# Patient Record
Sex: Female | Born: 1975 | Race: White | Hispanic: No | Marital: Married | Smoking: Never smoker
Health system: Southern US, Community
[De-identification: ages and names within clinical notes are randomized; demographics above are authoritative.]

## PROBLEM LIST (undated history)

## (undated) DIAGNOSIS — N200 Calculus of kidney: Secondary | ICD-10-CM

## (undated) DIAGNOSIS — N2589 Other disorders resulting from impaired renal tubular function: Secondary | ICD-10-CM

## (undated) DIAGNOSIS — K760 Fatty (change of) liver, not elsewhere classified: Secondary | ICD-10-CM

## (undated) DIAGNOSIS — Z8619 Personal history of other infectious and parasitic diseases: Secondary | ICD-10-CM

## (undated) DIAGNOSIS — E039 Hypothyroidism, unspecified: Secondary | ICD-10-CM

## (undated) DIAGNOSIS — M069 Rheumatoid arthritis, unspecified: Secondary | ICD-10-CM

## (undated) DIAGNOSIS — N2889 Other specified disorders of kidney and ureter: Secondary | ICD-10-CM

## (undated) DIAGNOSIS — N182 Chronic kidney disease, stage 2 (mild): Secondary | ICD-10-CM

## (undated) DIAGNOSIS — M1990 Unspecified osteoarthritis, unspecified site: Secondary | ICD-10-CM

## (undated) DIAGNOSIS — G43109 Migraine with aura, not intractable, without status migrainosus: Secondary | ICD-10-CM

## (undated) HISTORY — DX: Other specified disorders of kidney and ureter: N28.89

## (undated) HISTORY — DX: Fatty (change of) liver, not elsewhere classified: K76.0

## (undated) HISTORY — DX: Chronic kidney disease, stage 2 (mild): N18.2

## (undated) HISTORY — DX: Hypothyroidism, unspecified: E03.9

## (undated) HISTORY — DX: Calculus of kidney: N20.0

## (undated) HISTORY — DX: Personal history of other infectious and parasitic diseases: Z86.19

## (undated) HISTORY — PX: WISDOM TOOTH EXTRACTION: SHX21

## (undated) HISTORY — DX: Migraine with aura, not intractable, without status migrainosus: G43.109

## (undated) HISTORY — DX: Other disorders resulting from impaired renal tubular function: N25.89

## (undated) HISTORY — DX: Unspecified osteoarthritis, unspecified site: M19.90

## (undated) HISTORY — DX: Rheumatoid arthritis, unspecified: M06.9

---

## 2004-11-30 ENCOUNTER — Inpatient Hospital Stay (HOSPITAL_COMMUNITY): Admission: AD | Admit: 2004-11-30 | Discharge: 2004-11-30 | Payer: Self-pay | Admitting: Obstetrics and Gynecology

## 2005-01-11 ENCOUNTER — Other Ambulatory Visit: Admission: RE | Admit: 2005-01-11 | Discharge: 2005-01-11 | Payer: Self-pay | Admitting: Obstetrics and Gynecology

## 2005-07-07 ENCOUNTER — Inpatient Hospital Stay (HOSPITAL_COMMUNITY): Admission: AD | Admit: 2005-07-07 | Discharge: 2005-07-09 | Payer: Self-pay | Admitting: Obstetrics and Gynecology

## 2005-08-08 ENCOUNTER — Other Ambulatory Visit: Admission: RE | Admit: 2005-08-08 | Discharge: 2005-08-08 | Payer: Self-pay | Admitting: Obstetrics and Gynecology

## 2007-08-22 ENCOUNTER — Inpatient Hospital Stay (HOSPITAL_COMMUNITY): Admission: AD | Admit: 2007-08-22 | Discharge: 2007-08-22 | Payer: Self-pay | Admitting: Obstetrics and Gynecology

## 2007-08-31 ENCOUNTER — Inpatient Hospital Stay (HOSPITAL_COMMUNITY): Admission: AD | Admit: 2007-08-31 | Discharge: 2007-09-01 | Payer: Self-pay | Admitting: Obstetrics and Gynecology

## 2011-03-21 LAB — CBC
HCT: 28.5 — ABNORMAL LOW
Hemoglobin: 8.9 — ABNORMAL LOW
MCHC: 34.3
Platelets: 250
RBC: 3.43 — ABNORMAL LOW
RBC: 3.77 — ABNORMAL LOW
WBC: 14.9 — ABNORMAL HIGH

## 2012-06-06 ENCOUNTER — Other Ambulatory Visit: Payer: Self-pay | Admitting: Obstetrics and Gynecology

## 2012-06-06 DIAGNOSIS — N644 Mastodynia: Secondary | ICD-10-CM

## 2012-06-06 DIAGNOSIS — N6459 Other signs and symptoms in breast: Secondary | ICD-10-CM

## 2012-06-14 ENCOUNTER — Ambulatory Visit
Admission: RE | Admit: 2012-06-14 | Discharge: 2012-06-14 | Disposition: A | Discharge status: Home / Self Care | Payer: No Typology Code available for payment source | Source: Ambulatory Visit | Attending: Obstetrics and Gynecology | Admitting: Obstetrics and Gynecology

## 2012-06-14 ENCOUNTER — Other Ambulatory Visit: Payer: Self-pay | Admitting: Obstetrics and Gynecology

## 2012-06-14 DIAGNOSIS — N644 Mastodynia: Secondary | ICD-10-CM

## 2012-06-14 DIAGNOSIS — N6459 Other signs and symptoms in breast: Secondary | ICD-10-CM

## 2013-09-25 DIAGNOSIS — E039 Hypothyroidism, unspecified: Secondary | ICD-10-CM

## 2013-09-25 HISTORY — DX: Hypothyroidism, unspecified: E03.9

## 2014-01-17 ENCOUNTER — Ambulatory Visit (INDEPENDENT_AMBULATORY_CARE_PROVIDER_SITE_OTHER): Payer: No Typology Code available for payment source | Admitting: Nurse Practitioner

## 2014-01-17 ENCOUNTER — Encounter: Payer: Self-pay | Admitting: Nurse Practitioner

## 2014-01-17 VITALS — BP 102/68 | HR 71 | Temp 97.9°F | Resp 18 | Ht 67.2 in | Wt 106.0 lb

## 2014-01-17 DIAGNOSIS — L989 Disorder of the skin and subcutaneous tissue, unspecified: Secondary | ICD-10-CM

## 2014-01-17 DIAGNOSIS — E079 Disorder of thyroid, unspecified: Secondary | ICD-10-CM | POA: Insufficient documentation

## 2014-01-17 NOTE — Progress Notes (Signed)
Pre visit review using our clinic review tool, if applicable. No additional management support is needed unless otherwise documented below in the visit note. 

## 2014-01-17 NOTE — Progress Notes (Signed)
Subjective:     Elaine Hall is a 38 y.o. female wishes to establish care with dr Anitra Lauth. She is accompanied by her school age son. She has an appointment scheduled for next week. Today she c/o swollen area on L thigh. She noticed it yesterday. It is mildly tender. No discoloration. No known injury. History includes recent diagnosis of thyroid disease, diagnosed by gynecologist. Treatment started:levothyroxine, had recent dose reduction to 93mcg. Pt states prior to diagnosis, she had lost "a lot" of weight. Feels well.    History   Social History  . Marital Status: Married    Spouse Name: N/A    Number of Children: N/A  . Years of Education: N/A   Occupational History  . Not on file.   Social History Main Topics  . Smoking status: Never Smoker   . Smokeless tobacco: Not on file  . Alcohol Use: Not on file  . Drug Use: Not on file  . Sexual Activity: Yes   Other Topics Concern  . Not on file   Social History Narrative  . No narrative on file   There are no preventive care reminders to display for this patient.  The following portions of the patient's history were reviewed and updated as appropriate: allergies, current medications, past medical history and problem list.  Review of Systems Pertinent items are noted in HPI.   Objective:    BP 102/68  Pulse 71  Temp(Src) 97.9 F (36.6 C) (Oral)  Resp 18  Ht 5' 7.2" (1.707 m)  Wt 106 lb (48.081 kg)  BMI 16.50 kg/m2  SpO2 100%  LMP 01/08/2014 General appearance: alert, cooperative, appears stated age and no distress Head: Normocephalic, without obvious abnormality, atraumatic Eyes: negative findings: lids and lashes normal and conjunctivae and sclerae normal Extremities: extremities normal, atraumatic, no cyanosis or edema and soft, fluctuant, tender nodule lateral L thigh. No color change. About size of green pea. Pin point opening/hair follicle in center.    Assessment:   1. Skin lesion of left leg DD: cyst,  lipoma, insetc bite Watch & wait. See pt instructions. F/u 4 days.

## 2014-01-17 NOTE — Patient Instructions (Signed)
I do not know what the raised area on your leg is, possibly a lipoma-fatty tumor, fluid filled cyst, insect bite.  There does not appear to be infection. Let's monitor it over the weekend & have Dr Anitra Lauth take a look at it on Tuesday.  If you see major changes over the weekend-turns black, or becomes red & more painful, or you develop fever, stomach pain or headache consider going to ER for evaluation.

## 2014-01-21 ENCOUNTER — Ambulatory Visit (INDEPENDENT_AMBULATORY_CARE_PROVIDER_SITE_OTHER): Payer: No Typology Code available for payment source | Admitting: Family Medicine

## 2014-01-21 ENCOUNTER — Encounter: Payer: Self-pay | Admitting: Family Medicine

## 2014-01-21 VITALS — BP 113/69 | HR 67 | Temp 98.4°F | Resp 18 | Ht 67.0 in | Wt 107.5 lb

## 2014-01-21 DIAGNOSIS — R634 Abnormal weight loss: Secondary | ICD-10-CM | POA: Insufficient documentation

## 2014-01-21 DIAGNOSIS — L989 Disorder of the skin and subcutaneous tissue, unspecified: Secondary | ICD-10-CM

## 2014-01-21 DIAGNOSIS — E079 Disorder of thyroid, unspecified: Secondary | ICD-10-CM

## 2014-01-21 NOTE — Progress Notes (Addendum)
Office Note 01/22/2014  CC:  Chief Complaint  Patient presents with  . Establish Care  . Follow-up    saw Layne 01/17/14   HPI:  Elaine Hall is a 38 y.o. White female who is here to establish care with me.  She saw Nicky Pugh, FNP, here 4 d/a for lesion on leg. Patient's most recent primary MD: none.  GYN: Dr. Corinna Capra. Endo: Dr. Chalmers Cater (saw once and has f/u soon). Old records in EPIC/[facility] EMR were reviewed prior to or during today's visit.  Dx' hypothyroidism approx 10/2013 and after initial 100 mcg qd dosing x 77mo, her dose was dropped to 88 mcg qd.   No records avail from Dr. Chalmers Cater at this time.   Pt states that she was losing weight prior to dx and this is continuing despite good appetite and food intake. Says this has been a concern for about 1 yr.  Wt was about 120-122 lbs a year ago, currently at 107--this is her lowest point.  Aside from quite a few classic symptoms of hypothyroidism, she has not been particularly ill at all.  All sx's improving since getting on synthroid. See ROS below for more details.   Past Medical History  Diagnosis Date  . Hypothyroidism     FMB:WGYK  FH: noncontributory  History   Social History  . Marital Status: Married    Spouse Name: N/A    Number of Children: N/A  . Years of Education: N/A   Occupational History  . Not on file.   Social History Main Topics  . Smoking status: Never Smoker   . Smokeless tobacco: Not on file  . Alcohol Use: Not on file  . Drug Use: Not on file  . Sexual Activity: Yes   Other Topics Concern  . Not on file   Social History Narrative   Married, 2 children.   Government social research officer for [employer].   No tob, occ beer or glass of wine.   Exercise: occ treadmill, plays a lot with kids.   Diet: regular american diet.    Outpatient Encounter Prescriptions as of 01/21/2014  Medication Sig  . levothyroxine (SYNTHROID, LEVOTHROID) 88 MCG tablet Take 88 mcg by mouth daily before breakfast.     No Known Allergies  ROS Review of Systems  Constitutional: Positive for fatigue. Negative for fever, chills and appetite change.  HENT: Negative for congestion, dental problem, ear pain and sore throat (on and off "but not sick").        Hoarse quite often  Eyes: Negative for discharge, redness and visual disturbance.  Respiratory: Negative for cough, chest tightness, shortness of breath and wheezing.   Cardiovascular: Negative for chest pain, palpitations and leg swelling.  Gastrointestinal: Positive for abdominal pain (recurrent epigastric burning particularly with high fat/spicy food.  ) and constipation (prior to synthroid). Negative for nausea, vomiting, diarrhea and blood in stool.       No malabsorptive type diarrhea  Endocrine: Positive for cold intolerance (prior to synthroid).       Hair loss prior to synthroid   Genitourinary: Negative for dysuria, urgency, frequency, hematuria, flank pain and difficulty urinating.       Menorrhagia/dysmenorrhea--improved since synthroid but now "irregular".  LMP 12/29/13, prior to that it was 12/14/13.    Musculoskeletal: Negative for arthralgias, back pain, joint swelling, myalgias and neck stiffness.  Skin: Negative for pallor and rash.  Neurological: Negative for dizziness, speech difficulty, weakness and headaches (menstrual migraine x 1 yr prior to getting  on synthroid). Light-headedness: occ orthostatic lightheaded feeling.  Hematological: Negative for adenopathy. Does not bruise/bleed easily.  Psychiatric/Behavioral: Negative for confusion, sleep disturbance and dysphoric mood. The patient is nervous/anxious (has never been treated with med or counseling).     PE; Blood pressure 113/69, pulse 67, temperature 98.4 F (36.9 C), temperature source Temporal, resp. rate 18, height 5\' 7"  (1.702 m), weight 107 lb 8 oz (48.762 kg), last menstrual period 12/29/2013, SpO2 100.00%. Pt examined with Ival Bible, CMA, as chaperone. Gen: Alert,  well appearing very thin white female in NAD.  Patient is oriented to person, place, time, and situation. VXY:IAXK: no injection, icteris, swelling, or exudate.  EOMI, PERRLA. Mouth: lips without lesion/swelling.  Oral mucosa pink and moist. Oropharynx without erythema, exudate, or swelling.  Neck - No masses or thyromegaly or limitation in range of motion.  I really cannot feel much thyroid tissue at all.  No thyroid nodule, no tenderness.   CV: RRR, no m/r/g.   LUNGS: CTA bilat, nonlabored resps, good aeration in all lung fields. ABD: soft, NT, ND, BS normal.  No hepatospenomegaly or mass.  No bruits. EXT: no clubbing, cyanosis, or edema.  Left lateral thigh with pea sized soft and moveable nodule in deep soft tissues, nontender.  No fluctuance and no overlying skin changes. Musculoskeletal: no joint swelling, erythema, warmth, or tenderness.  ROM of all joints intact. Neuro: CN 2-12 intact bilaterally, strength 5/5 in proximal and distal upper extremities and lower extremities bilaterally.  No sensory deficits.  No tremor.  No disdiadochokinesis.  No ataxia.  Upper extremity and lower extremity DTRs symmetric.  No pronator drift.  Pertinent labs:  None today  ASSESSMENT AND PLAN:   Abnormal weight loss Unclear etiology. Need to review old records from Dr. Chalmers Cater and Dr. Corinna Capra. Will call patient with w/u plan after review of records.  Thyroid disease Hypothyroidism: continue 88 mcg qd dosing. She has plans to f/u with Dr. Chalmers Cater for recheck in office and recheck TSH in the near future.  Skin lesion of left leg Feels benign, most likely a small dermoid cyst. Reassured pt. Signs/symptoms to call or return for were reviewed and pt expressed understanding.   An After Visit Summary was printed and given to the patient.  Return if symptoms worsen or fail to improve.

## 2014-01-21 NOTE — Progress Notes (Signed)
Pre visit review using our clinic review tool, if applicable. No additional management support is needed unless otherwise documented below in the visit note. 

## 2014-01-22 NOTE — Assessment & Plan Note (Signed)
Hypothyroidism: continue 88 mcg qd dosing. She has plans to f/u with Dr. Chalmers Cater for recheck in office and recheck TSH in the near future.

## 2014-01-22 NOTE — Assessment & Plan Note (Signed)
Feels benign, most likely a small dermoid cyst. Reassured pt. Signs/symptoms to call or return for were reviewed and pt expressed understanding.

## 2014-01-22 NOTE — Assessment & Plan Note (Signed)
Unclear etiology. Need to review old records from Dr. Chalmers Cater and Dr. Corinna Capra. Will call patient with w/u plan after review of records.

## 2014-01-31 ENCOUNTER — Encounter: Payer: Self-pay | Admitting: Family Medicine

## 2014-01-31 DIAGNOSIS — E559 Vitamin D deficiency, unspecified: Secondary | ICD-10-CM | POA: Insufficient documentation

## 2014-02-03 ENCOUNTER — Telehealth: Payer: Self-pay | Admitting: Family Medicine

## 2014-02-03 ENCOUNTER — Other Ambulatory Visit: Payer: Self-pay | Admitting: Family Medicine

## 2014-02-03 DIAGNOSIS — R634 Abnormal weight loss: Secondary | ICD-10-CM

## 2014-02-03 DIAGNOSIS — R5381 Other malaise: Secondary | ICD-10-CM

## 2014-02-03 DIAGNOSIS — R5383 Other fatigue: Secondary | ICD-10-CM

## 2014-02-03 DIAGNOSIS — I959 Hypotension, unspecified: Secondary | ICD-10-CM

## 2014-02-03 NOTE — Telephone Encounter (Signed)
Called pt to review records/labs done by Dr.'s Corinna Capra and Chalmers Cater, now that I have received their records.   Discussed further w/u for her abnormal wt loss: she has not weighed herself since being here last. She has f/u in middle of august for recheck of TSH at Dr. Almetta Lovely office. I recommended she get serum cortisol and ACTH here early AM at earliest convenience.  Also recommended CXR and hemoccults x 3. She was agreeable to these tests and said she'll call to make lab appt ASAP.

## 2014-02-04 ENCOUNTER — Other Ambulatory Visit: Payer: No Typology Code available for payment source

## 2014-02-04 DIAGNOSIS — R634 Abnormal weight loss: Secondary | ICD-10-CM

## 2014-02-04 DIAGNOSIS — R5383 Other fatigue: Secondary | ICD-10-CM

## 2014-02-04 DIAGNOSIS — I959 Hypotension, unspecified: Secondary | ICD-10-CM

## 2014-02-04 DIAGNOSIS — R5381 Other malaise: Secondary | ICD-10-CM

## 2014-02-05 ENCOUNTER — Other Ambulatory Visit: Payer: No Typology Code available for payment source

## 2014-02-05 LAB — CORTISOL-AM, BLOOD: Cortisol - AM: 9.1 ug/dL (ref 4.3–22.4)

## 2014-02-07 LAB — ACTH: C206 ACTH: 25 pg/mL (ref 6–50)

## 2014-02-07 NOTE — Progress Notes (Signed)
Yes, still get chest x-ray and still do the hemoccult cards.-thx

## 2014-02-11 ENCOUNTER — Other Ambulatory Visit: Payer: Self-pay

## 2014-02-11 ENCOUNTER — Ambulatory Visit (HOSPITAL_BASED_OUTPATIENT_CLINIC_OR_DEPARTMENT_OTHER)
Admission: RE | Admit: 2014-02-11 | Discharge: 2014-02-11 | Disposition: A | Discharge status: Home / Self Care | Payer: No Typology Code available for payment source | Source: Ambulatory Visit | Attending: Family Medicine | Admitting: Family Medicine

## 2014-02-11 DIAGNOSIS — R634 Abnormal weight loss: Secondary | ICD-10-CM | POA: Insufficient documentation

## 2014-02-11 DIAGNOSIS — R5383 Other fatigue: Secondary | ICD-10-CM | POA: Diagnosis not present

## 2014-02-11 DIAGNOSIS — I959 Hypotension, unspecified: Secondary | ICD-10-CM | POA: Diagnosis not present

## 2014-02-11 DIAGNOSIS — R5381 Other malaise: Secondary | ICD-10-CM | POA: Insufficient documentation

## 2014-02-11 DIAGNOSIS — R918 Other nonspecific abnormal finding of lung field: Secondary | ICD-10-CM | POA: Insufficient documentation

## 2014-02-11 DIAGNOSIS — Z1211 Encounter for screening for malignant neoplasm of colon: Secondary | ICD-10-CM

## 2014-02-11 LAB — POC HEMOCCULT BLD/STL (HOME/3-CARD/SCREEN)
Card #3 Fecal Occult Blood, POC: NEGATIVE
FECAL OCCULT BLD: NEGATIVE
Fecal Occult Blood, POC: NEGATIVE

## 2014-05-28 ENCOUNTER — Ambulatory Visit (INDEPENDENT_AMBULATORY_CARE_PROVIDER_SITE_OTHER): Payer: No Typology Code available for payment source | Admitting: Family Medicine

## 2014-05-28 ENCOUNTER — Encounter: Payer: Self-pay | Admitting: Family Medicine

## 2014-05-28 VITALS — BP 107/72 | HR 73 | Temp 99.2°F | Resp 18 | Ht 67.0 in | Wt 109.0 lb

## 2014-05-28 DIAGNOSIS — K1121 Acute sialoadenitis: Secondary | ICD-10-CM

## 2014-05-28 MED ORDER — CEFTRIAXONE SODIUM 1 G IJ SOLR
1.0000 g | Freq: Once | INTRAMUSCULAR | Status: AC
  Administered 2014-05-28: 1 g via INTRAMUSCULAR

## 2014-05-28 MED ORDER — AMOXICILLIN-POT CLAVULANATE 875-125 MG PO TABS: 1.0000 | ORAL_TABLET | Freq: Two times a day (BID) | ORAL | Status: DC

## 2014-05-28 NOTE — Progress Notes (Signed)
OFFICE NOTE  05/28/2014  CC:  Chief Complaint  Patient presents with  . Facial Swelling    right sided   HPI: Patient is a 38 y.o. Caucasian female who is here for feeling markedly swollen glandson left side of face/neck.  Started with mild ST 4 nights ago, mildly achy---like she was getting mildly sick.  ST is gone now.  No fever.  Hurts to open jaw, feels hard on left angle of jaw.  Chewing definitely hurts, but also salivating makes it ache.  Took advil one day.  Pertinent PMH:  Past medical, surgical, social, and family history reviewed and no changes are noted since last office visit.  MEDS:  Outpatient Prescriptions Prior to Visit  Medication Sig Dispense Refill  . levothyroxine (SYNTHROID, LEVOTHROID) 88 MCG tablet Take 88 mcg by mouth daily before breakfast.     No facility-administered medications prior to visit.    PE: Blood pressure 107/72, pulse 73, temperature 99.2 F (37.3 C), temperature source Temporal, resp. rate 18, height 5\' 7"  (1.702 m), weight 109 lb (49.442 kg), SpO2 100 %. Gen: Alert, well appearing.  Patient is oriented to person, place, time, and situation. ENT: Ears: EACs clear, normal epithelium.  TMs with good light reflex and landmarks bilaterally.  Eyes: no injection, icteris, swelling, or exudate.  EOMI, PERRLA. Nose: no drainage or turbinate edema/swelling.  No injection or focal lesion.  Mouth: lips without lesion/swelling.  Oral mucosa pink and moist.  Dentition intact and without obvious caries or gingival swelling.  Oropharynx without erythema, exudate, or swelling.  Left parotid gland very firm and tender, enlarged over angle of left mandible (4cm x 4cm) without erythema or overlying skin changes.  No mandible tenderness.  Mild submandibular LAD bilat with mild tenderness in this area bilat.  Right parotid nontender and w/out enlargement.   IMPRESSION AND PLAN:  Acute left parotitis, possibly bacterial. Possibly sialolithiasis. Rocephin 1g IM  today in office. Augmentin 875mg  bid  X 10d to start tomorrow. Use of saliva-inducing candies/foods discussed. Discussed parameters for f/u.

## 2014-05-28 NOTE — Progress Notes (Signed)
Pre visit review using our clinic review tool, if applicable. No additional management support is needed unless otherwise documented below in the visit note. 

## 2014-08-20 ENCOUNTER — Telehealth: Payer: Self-pay | Admitting: Family Medicine

## 2014-08-20 MED ORDER — AMOXICILLIN-POT CLAVULANATE 875-125 MG PO TABS: 1.0000 | ORAL_TABLET | Freq: Two times a day (BID) | ORAL | Status: DC

## 2014-08-20 NOTE — Telephone Encounter (Signed)
Patient is swelling in check area again. Can she pick up an Rx for Augmentin again? It worked well last time. Please call.

## 2014-08-20 NOTE — Telephone Encounter (Signed)
OK augmentin eRx'd.

## 2014-08-20 NOTE — Telephone Encounter (Signed)
Pt aware.   Advised patient that if it does return to come into office to be reevaluated.

## 2014-08-20 NOTE — Telephone Encounter (Signed)
Please advise 

## 2015-02-06 ENCOUNTER — Telehealth: Payer: Self-pay | Admitting: Geriatric Medicine

## 2015-02-06 MED ORDER — AMOXICILLIN-POT CLAVULANATE 875-125 MG PO TABS: 1.0000 | ORAL_TABLET | Freq: Two times a day (BID) | ORAL | Status: DC

## 2015-02-06 NOTE — Telephone Encounter (Signed)
Spoke with patient and she will go pick up prescription.

## 2015-02-06 NOTE — Telephone Encounter (Signed)
Patient called and said that her salivary glands are becoming infected again. She said she knows the symptoms and this is the 3rd time you have had to treat her for this issue. She would like medication to be sent to her pharmacy, [facility] in [place]. Does she need an office visit, or would you be willing to send something in? Please advise, thanks.

## 2015-02-06 NOTE — Telephone Encounter (Signed)
Augmentin rx sent to pt's pharmacy.

## 2015-07-10 ENCOUNTER — Other Ambulatory Visit: Payer: Self-pay | Admitting: *Deleted

## 2015-07-10 MED ORDER — AMOXICILLIN-POT CLAVULANATE 875-125 MG PO TABS: 1.0000 | ORAL_TABLET | Freq: Two times a day (BID) | ORAL | Status: DC

## 2015-07-10 NOTE — Telephone Encounter (Signed)
Pt called requesting a refill for the generic Augmentin. She stated that she seen Dr. Anitra Lauth last year for some swelling of her gland in her neck under her jaw and she was given this antibiotic and it worked really well. She stated that she feels like this is coming back and that she would like to get the antibiotic because she is leaving town today in an hour. She wants this sent to [facility]. She stated that she left a voice message yesterday but I did not receive it. Please advise. Thanks.

## 2015-07-10 NOTE — Telephone Encounter (Signed)
Rx sent.  Pt advised and voiced understanding.   

## 2015-08-12 ENCOUNTER — Telehealth: Payer: Self-pay | Admitting: Family Medicine

## 2015-08-12 NOTE — Telephone Encounter (Signed)
[facility]    --------------------------------------------------------------------------------   Patient Name: Elaine Hall  Gender: Female  DOB: 11/06/1975   Age: 40 Y 2 M 17 D  Return Phone Number: [phone] (Primary)  Address:     City/State/Zip:  [place]     Client [facility]  Client [facility]  Physician McGowen, Phil   Contact Type Call  Who Is Calling Patient / Member / Family / Caregiver  Call Type Triage / Clinical  Relationship To Patient Self  Return Phone Number [phone] (Primary)  Chief Complaint CHEST PAIN (>=21 years) - pain, pressure, heaviness or tightness  Reason for Call Symptomatic / Request for [place] states she needs an appointment - has chest tightness when breathing in deep or swallowing  Appointment Disposition EMR Appointment Not Necessary  PreDisposition Call Doctor  Translation No       Nurse Assessment  Nurse: Amalia Hailey, RN, Melissa Date/Time (Eastern Time): 08/12/2015 3:40:15 PM  Confirm and document reason for call. If symptomatic, describe symptoms. You must click the next button to save text entered. ---Caller states she needs an appointment - has chest tightness when breathing in deep or swallowing    Has the patient traveled out of the country within the last 30 days? ---Not Applicable    Does the patient have any new or worsening symptoms? ---Yes    Will a triage be completed? ---Yes    Related visit to physician within the last 2 weeks? ---No    Does the PT have any chronic conditions? (i.e. diabetes, asthma, etc.) ---Yes    List chronic conditions. ---hypothyroidism    Is the patient pregnant or possibly pregnant? (Ask all females between the ages of 55-55) ---No    Is this a behavioral health or substance abuse call? ---No            Guidelines          Guideline Title Affirmed Question Affirmed Notes Nurse Date/Time Eilene Ghazi Time)  Chest Pain Taking a deep breath makes pain worse    Amalia Hailey, RN, Melissa 08/12/2015 3:41:31 PM    Disp. Time Eilene Ghazi Time) Disposition Final User    08/12/2015 3:38:28 PM Send to Urgent Queue   [place]    08/12/2015 3:46:51 PM Go to ED Now (or PCP triage)   Amalia Hailey, RN, Melissa      08/12/2015 3:46:53 PM Go to ED Now (or PCP triage) Lanelle Bal, RN, Emelia Salisbury Understands: Yes  Disagree/Comply: Comply       Care Advice Given Per Guideline        GO TO ED NOW (OR PCP TRIAGE): * IF NO PCP TRIAGE: You need to be seen. [facility] to the [facility] at [facility] within the next hour. Leave as soon as you can. * You become worse. BRING MEDICINES: CALL EMS IF: * Passes out or becomes too weak to stand * Severe difficulty breathing occurs        --------------------------------------------------------------------------------         Comments  User: Colin Ina, RN Date/Time (Eastern Time): 08/12/2015 3:47:19 PM  Caller reports she lives in [place] and will go to [facility] where she is.    Referrals  GO TO FACILITY UNDECIDED  [facility]

## 2015-08-13 NOTE — Telephone Encounter (Signed)
Noted agree

## 2015-11-18 ENCOUNTER — Encounter: Payer: Self-pay | Admitting: Diagnostic Neuroimaging

## 2015-11-18 ENCOUNTER — Ambulatory Visit (INDEPENDENT_AMBULATORY_CARE_PROVIDER_SITE_OTHER): Payer: No Typology Code available for payment source | Admitting: Diagnostic Neuroimaging

## 2015-11-18 VITALS — BP 111/78 | HR 71 | Ht 67.0 in | Wt 110.2 lb

## 2015-11-18 DIAGNOSIS — R208 Other disturbances of skin sensation: Secondary | ICD-10-CM | POA: Diagnosis not present

## 2015-11-18 DIAGNOSIS — R42 Dizziness and giddiness: Secondary | ICD-10-CM

## 2015-11-18 DIAGNOSIS — R200 Anesthesia of skin: Secondary | ICD-10-CM

## 2015-11-18 DIAGNOSIS — G43109 Migraine with aura, not intractable, without status migrainosus: Secondary | ICD-10-CM | POA: Diagnosis not present

## 2015-11-18 MED ORDER — RIZATRIPTAN BENZOATE 10 MG PO TBDP: 10.0000 mg | ORAL_TABLET | ORAL | Status: DC | PRN

## 2015-11-18 NOTE — Progress Notes (Signed)
[facility]  PATIENT: Elaine Hall DOB: 27-Dec-1975  REFERRING CLINICIAN: Louretta Shorten HISTORY FROM: patient and husband REASON FOR VISIT: new consult    HISTORICAL  CHIEF COMPLAINT:  Chief Complaint  Patient presents with  . Headache    rm 6, New Pt, husband- [name], "knot on back of neck, causes HA the go up neck to left eye, vision chages, blurry, I go to bed for relief, no OTC meds helpful, x 1 year"    HISTORY OF PRESENT ILLNESS:   40 year old female with intermittent headaches. Patient reports one year of intermittent headache starting in the left side of the neck, radiating to the left scalp in left eye. Sometimes associated with blurred vision and sensitivity to light. No significant nausea or vomiting. Sometimes has severe sharp pain with throbbing sensation. Sometimes associated with intermittent left hand numbness. No specific warning. No specific triggering factors. Sometimes patient feels a "knot" in the back of the neck on left side. Patient has tried over-the-counter medications without relief.   REVIEW OF SYSTEMS: Full 14 system review of systems performed and negative with exception of: As per history of present illness.  ALLERGIES: No Known Allergies  HOME MEDICATIONS: Outpatient Prescriptions Prior to Visit  Medication Sig Dispense Refill  . amoxicillin-clavulanate (AUGMENTIN) 875-125 MG tablet Take 1 tablet by mouth 2 (two) times daily. 20 tablet 0  . levothyroxine (SYNTHROID, LEVOTHROID) 88 MCG tablet Take 88 mcg by mouth daily before breakfast.     No facility-administered medications prior to visit.    PAST MEDICAL HISTORY: Past Medical History  Diagnosis Date  . Hypothyroidism 09/2013    PAST SURGICAL HISTORY: Past Surgical History  Procedure Laterality Date  . Wisdom tooth extraction      years ago    FAMILY HISTORY: Family History  Problem Relation Age of Onset  . Migraines Mother   . Migraines Sister      SOCIAL HISTORY:  Social History   Social History  . Marital Status: Married    Spouse Name: [name]  . Number of Children: 2  . Years of Education: 16   Occupational History  .      self employed   Social History Main Topics  . Smoking status: Never Smoker   . Smokeless tobacco: Not on file  . Alcohol Use: Yes     Comment: socially  . Drug Use: No  . Sexual Activity: Yes   Other Topics Concern  . Not on file   Social History Narrative   Married, 2 children.   Government social research officer for [employer].   No tob, occ beer or glass of wine.   Exercise: occ treadmill, plays a lot with kids.   Diet: regular american diet.   Caffeine use- soda maybe once a week, chocolate     PHYSICAL EXAM  GENERAL EXAM/CONSTITUTIONAL: Vitals:  Filed Vitals:   11/18/15 0856  BP: 111/78  Pulse: 71  Height: 5\' 7"  (1.702 m)  Weight: 110 lb 3.2 oz (49.986 kg)     Body mass index is 17.26 kg/(m^2).  Visual Acuity Screening   Right eye Left eye Both eyes  Without correction: 20/70 20/30   With correction:        Patient is in no distress; well developed, nourished and groomed; neck is supple  CARDIOVASCULAR:  Examination of carotid arteries is normal; no carotid bruits  Regular rate and rhythm, no murmurs  Examination of peripheral vascular system by observation and palpation is normal  EYES:  Ophthalmoscopic exam of optic discs and posterior segments is normal; no papilledema or hemorrhages  MUSCULOSKELETAL:  Gait, strength, tone, movements noted in Neurologic exam below  NEUROLOGIC: MENTAL STATUS:  No flowsheet data found.  awake, alert, oriented to person, place and time  recent and remote memory intact  normal attention and concentration  language fluent, comprehension intact, naming intact,   fund of knowledge appropriate  CRANIAL NERVE:   2nd - no papilledema on fundoscopic exam  2nd, 3rd, 4th, 6th - pupils equal and reactive to light, visual  fields full to confrontation, extraocular muscles intact, no nystagmus  5th - facial sensation symmetric  7th - facial strength symmetric  8th - hearing intact  9th - palate elevates symmetrically, uvula midline  11th - shoulder shrug symmetric  12th - tongue protrusion midline  MOTOR:   normal bulk and tone, full strength in the BUE, BLE  SENSORY:   normal and symmetric to light touch, temperature, vibration  COORDINATION:   finger-nose-finger, fine finger movements normal  REFLEXES:   deep tendon reflexes present and symmetric  GAIT/STATION:   narrow based gait; able to walk tandem; romberg is negative    DIAGNOSTIC DATA (LABS, IMAGING, TESTING) - I reviewed patient records, labs, notes, testing and imaging myself where available.  Lab Results  Component Value Date   WBC 14.8* 09/01/2007   HGB 8.9* 09/01/2007   HCT 25.9* 09/01/2007   MCV 75.6* 09/01/2007   PLT 231 09/01/2007   No results found for: NA, K, CL, CO2, GLUCOSE, BUN, CREATININE, CALCIUM, PROT, ALBUMIN, AST, ALT, ALKPHOS, BILITOT, GFRNONAA, GFRAA No results found for: CHOL, HDL, LDLCALC, LDLDIRECT, TRIG, CHOLHDL No results found for: HGBA1C No results found for: VITAMINB12 No results found for: TSH  02/11/14 CXR [I reviewed images myself and agree with interpretation. -VRP]  - Hyperinflation and minimal peribronchial thickening which could reflect asthma. - No acute infiltrate.    ASSESSMENT AND PLAN  40 y.o. year old female here with 1 year history of intermittent headaches, sometimes associated with left hand numbness and dizziness. Most likely represents migraine phenomenon. We'll check MRI brain to rule out secondary causes.   Ddx: migraine with aura (likely) vs secondary headache (structural, vascular, inflamm, metabolic, insomnia)  1. Migraine with aura and without status migrainosus, not intractable   2. Numbness of left hand   3. Dizziness and giddiness      PLAN: - check  MRI brain - rizatriptan 10mg  as needed for breakthrough headache; may repeat x 1 after 2 hours; max 2 tabs per day or 8 per month - may consider migraine preventative med in future (such as amitriptyline) - nutrition, fitness, sleep hygiene reviewed  Orders Placed This Encounter  Procedures  . MR Brain Wo Contrast   Meds ordered this encounter  Medications  . rizatriptan (MAXALT-MLT) 10 MG disintegrating tablet    Sig: Take 1 tablet (10 mg total) by mouth as needed for migraine. May repeat in 2 hours if needed    Dispense:  9 tablet    Refill:  11   Return in about 2 months (around 01/18/2016).    Penni Bombard, MD [id number], [id number] AM Certified in Neurology, Neurophysiology and Neuroimaging  [facility] [address], [place],  [address] [phone]

## 2015-11-18 NOTE — Patient Instructions (Signed)
Thank you for coming to see [place] at [facility]. I hope we have been able to provide you high quality care today.  You may receive a patient satisfaction survey over the next few weeks. We would appreciate your feedback and comments so that we may continue to improve ourselves and the health of our patients.  - I will check MRI brain - use rizatriptan 14m as needed for breakthrough headache; may repeat x 1 after 2 hours; max 2 tabs per day or 8 per month    ~~~~~~~~~~~~~~~~~~~~~~~~~~~~~~~~~~~~~~~~~~~~~~~~~~~~~~~~~~~~~~~~~  DR. PENUMALLI'S GUIDE TO HAPPY AND HEALTHY LIVING These are some of my general health and wellness recommendations. Some of them may apply to you better than others. Please use common sense as you try these suggestions and feel free to ask me any questions.   ACTIVITY/FITNESS Mental, social, emotional and physical stimulation are very important for brain and body health. Try learning a new activity (arts, music, language, sports, games).  Keep moving your body to the best of your abilities. You can do this at home, inside or outside, the park, community center, gym or anywhere you like. Consider a physical therapist or personal trainer to get started. Consider the app Sworkit. Fitness trackers such as smart-watches, smart-phones or Fitbits can help as well.   NUTRITION Eat more plants: colorful vegetables, nuts, seeds and berries.  Eat less sugar, salt, preservatives and processed foods.  Avoid toxins such as cigarettes and alcohol.  Drink water when you are thirsty. Warm water with a slice of lemon is an excellent morning drink to start the day.  Consider these websites for more information The Nutrition Source (hhttps://www.henry-hernandez.biz/ Precision Nutrition (wWindowBlog.ch   RELAXATION Consider practicing mindfulness meditation or other relaxation techniques such as deep breathing, prayer,  yoga, tai chi, massage. See website mindful.org or the apps Headspace or Calm to help get started.   SLEEP Try to get at least 7-8+ hours sleep per day. Regular exercise and reduced caffeine will help you sleep better. Practice good sleep hygeine techniques. See website sleep.org for more information.   PLANNING Prepare estate planning, living will, healthcare POA documents. Sometimes this is best planned with the help of an attorney. Theconversationproject.org and agingwithdignity.org are excellent resources.

## 2015-12-31 ENCOUNTER — Encounter: Payer: Self-pay | Admitting: Diagnostic Neuroimaging

## 2016-01-05 ENCOUNTER — Telehealth: Payer: Self-pay | Admitting: Diagnostic Neuroimaging

## 2016-01-05 NOTE — Telephone Encounter (Signed)
Pt called sts she is not having HA's, she is "so happy". She wanted to push appt out 6 mths. Operator advised since Dr Mamie Nick wanted to see her in 60mths maybe not go out > 4 mths. appt was made for 9/11

## 2016-01-20 ENCOUNTER — Ambulatory Visit: Payer: No Typology Code available for payment source | Admitting: Diagnostic Neuroimaging

## 2016-03-07 ENCOUNTER — Ambulatory Visit (INDEPENDENT_AMBULATORY_CARE_PROVIDER_SITE_OTHER): Payer: No Typology Code available for payment source | Admitting: Diagnostic Neuroimaging

## 2016-03-07 ENCOUNTER — Encounter: Payer: Self-pay | Admitting: Diagnostic Neuroimaging

## 2016-03-07 VITALS — BP 106/64 | HR 62 | Wt 114.0 lb

## 2016-03-07 DIAGNOSIS — G43109 Migraine with aura, not intractable, without status migrainosus: Secondary | ICD-10-CM | POA: Diagnosis not present

## 2016-03-07 MED ORDER — RIZATRIPTAN BENZOATE 10 MG PO TBDP: 10.0000 mg | ORAL_TABLET | ORAL | 11 refills | Status: DC | PRN

## 2016-03-07 NOTE — Progress Notes (Signed)
[facility]  PATIENT: Elaine Hall DOB: 1976-05-16  REFERRING CLINICIAN: Louretta Shorten HISTORY FROM: patient REASON FOR VISIT: follow up   HISTORICAL  CHIEF COMPLAINT:  Chief Complaint  Patient presents with  . Migraine    rm 7, "I've only had to take rizatriptan 6 times since last visit."  . Follow-up    2 month    HISTORY OF PRESENT ILLNESS:   UPDATE 03/07/16: Since last visit doing well on rizatriptan, which helps relive HA. Has had 6 headaches since last visit, less severe. Patient declined MRI due to cost and improving symptoms.  PRIOR HPI (11/18/15): 40 year old female with intermittent headaches. Patient reports one year of intermittent headache starting in the left side of the neck, radiating to the left scalp in left eye. Sometimes associated with blurred vision and sensitivity to light. No significant nausea or vomiting. Sometimes has severe sharp pain with throbbing sensation. Sometimes associated with intermittent left hand numbness. No specific warning. No specific triggering factors. Sometimes patient feels a "knot" in the back of the neck on left side. Patient has tried over-the-counter medications without relief.    REVIEW OF SYSTEMS: Full 14 system review of systems performed and negative with exception of: As per history of present illness.  ALLERGIES: No Known Allergies  HOME MEDICATIONS: Outpatient Medications Prior to Visit  Medication Sig Dispense Refill  . levothyroxine (SYNTHROID, LEVOTHROID) 88 MCG tablet Take 88 mcg by mouth daily before breakfast.    . rizatriptan (MAXALT-MLT) 10 MG disintegrating tablet Take 1 tablet (10 mg total) by mouth as needed for migraine. May repeat in 2 hours if needed 9 tablet 11  . amoxicillin-clavulanate (AUGMENTIN) 875-125 MG tablet Take 1 tablet by mouth 2 (two) times daily. (Patient not taking: Reported on 03/07/2016) 20 tablet 0   No facility-administered medications prior to visit.     PAST  MEDICAL HISTORY: Past Medical History:  Diagnosis Date  . Hypothyroidism 09/2013    PAST SURGICAL HISTORY: Past Surgical History:  Procedure Laterality Date  . WISDOM TOOTH EXTRACTION     years ago    FAMILY HISTORY: Family History  Problem Relation Age of Onset  . Migraines Mother   . Migraines Sister     SOCIAL HISTORY:  Social History   Social History  . Marital status: Married    Spouse name: [name]  . Number of children: 2  . Years of education: 16   Occupational History  .      self employed   Social History Main Topics  . Smoking status: Never Smoker  . Smokeless tobacco: Not on file  . Alcohol use Yes     Comment: socially  . Drug use: No  . Sexual activity: Yes   Other Topics Concern  . Not on file   Social History Narrative   Married, 2 children.   Government social research officer for [employer].   No tob, occ beer or glass of wine.   Exercise: occ treadmill, plays a lot with kids.   Diet: regular american diet.   Caffeine use- soda maybe once a week, chocolate     PHYSICAL EXAM  GENERAL EXAM/CONSTITUTIONAL: Vitals:  Vitals:   03/07/16 0817  BP: 106/64  Pulse: 62  Weight: 114 lb (51.7 kg)   Body mass index is 17.85 kg/m. No exam data present  Patient is in no distress; well developed, nourished and groomed; neck is supple  CARDIOVASCULAR:  Examination of carotid arteries is normal; no carotid bruits  Regular rate  and rhythm, no murmurs  Examination of peripheral vascular system by observation and palpation is normal  EYES:  Ophthalmoscopic exam of optic discs and posterior segments is normal; no papilledema or hemorrhages  MUSCULOSKELETAL:  Gait, strength, tone, movements noted in Neurologic exam below  NEUROLOGIC: MENTAL STATUS:  No flowsheet data found.  awake, alert, oriented to person, place and time  recent and remote memory intact  normal attention and concentration  language fluent, comprehension intact, naming  intact,   fund of knowledge appropriate  CRANIAL NERVE:   2nd - no papilledema on fundoscopic exam  2nd, 3rd, 4th, 6th - pupils equal and reactive to light, visual fields full to confrontation, extraocular muscles intact, no nystagmus  5th - facial sensation symmetric  7th - facial strength symmetric  8th - hearing intact  9th - palate elevates symmetrically, uvula midline  11th - shoulder shrug symmetric  12th - tongue protrusion midline  MOTOR:   normal bulk and tone, full strength in the BUE, BLE  SENSORY:   normal and symmetric to light touch, temperature, vibration  COORDINATION:   finger-nose-finger, fine finger movements normal  REFLEXES:   deep tendon reflexes present and symmetric  GAIT/STATION:   narrow based gait; able to walk tandem; romberg is negative    DIAGNOSTIC DATA (LABS, IMAGING, TESTING) - I reviewed patient records, labs, notes, testing and imaging myself where available.  Lab Results  Component Value Date   WBC 14.8 (H) 09/01/2007   HGB 8.9 (L) 09/01/2007   HCT 25.9 (L) 09/01/2007   MCV 75.6 (L) 09/01/2007   PLT 231 09/01/2007   No results found for: NA, K, CL, CO2, GLUCOSE, BUN, CREATININE, CALCIUM, PROT, ALBUMIN, AST, ALT, ALKPHOS, BILITOT, GFRNONAA, GFRAA No results found for: CHOL, HDL, LDLCALC, LDLDIRECT, TRIG, CHOLHDL No results found for: HGBA1C No results found for: VITAMINB12 No results found for: TSH  02/11/14 CXR [I reviewed images myself and agree with interpretation. -VRP]  - Hyperinflation and minimal peribronchial thickening which could reflect asthma. - No acute infiltrate.    ASSESSMENT AND PLAN  40 y.o. year old female here with intermittent headaches since 2016, sometimes associated with left hand numbness and dizziness. Most likely represents migraine phenomenon. Symptoms improving.  Ddx: migraine with aura (likely)  1. Migraine with aura and without status migrainosus, not intractable       PLAN: - continue rizatriptan 10mg  as needed for breakthrough headache; may repeat x 1 after 2 hours; max 2 tabs per day or 8 per month - may consider migraine preventative med in future (such as amitriptyline) - nutrition, fitness, sleep hygiene reviewed  Meds ordered this encounter  Medications  . rizatriptan (MAXALT-MLT) 10 MG disintegrating tablet    Sig: Take 1 tablet (10 mg total) by mouth as needed for migraine. May repeat in 2 hours if needed    Dispense:  9 tablet    Refill:  11   Return in about 6 months (around 09/04/2016).    Penni Bombard, MD [id number], [id number] AM Certified in Neurology, Neurophysiology and Neuroimaging  [facility] [address] [place], [place] [address] [phone]

## 2016-03-08 ENCOUNTER — Encounter: Payer: Self-pay | Admitting: Family Medicine

## 2016-09-05 ENCOUNTER — Ambulatory Visit: Payer: No Typology Code available for payment source | Admitting: Diagnostic Neuroimaging

## 2016-09-14 ENCOUNTER — Ambulatory Visit (INDEPENDENT_AMBULATORY_CARE_PROVIDER_SITE_OTHER): Payer: No Typology Code available for payment source | Admitting: Family Medicine

## 2016-09-14 ENCOUNTER — Encounter: Payer: Self-pay | Admitting: Family Medicine

## 2016-09-14 VITALS — BP 100/67 | HR 67 | Temp 98.4°F | Resp 20 | Wt 111.5 lb

## 2016-09-14 DIAGNOSIS — R599 Enlarged lymph nodes, unspecified: Secondary | ICD-10-CM | POA: Diagnosis not present

## 2016-09-14 DIAGNOSIS — J029 Acute pharyngitis, unspecified: Secondary | ICD-10-CM | POA: Diagnosis not present

## 2016-09-14 MED ORDER — AMOXICILLIN-POT CLAVULANATE 875-125 MG PO TABS: 1.0000 | ORAL_TABLET | Freq: Two times a day (BID) | ORAL | 0 refills | Status: DC

## 2016-09-14 NOTE — Patient Instructions (Signed)
-   Augmentin prescribed. - Rest, hydrate.  Follow up as needed or if symptoms not resolved in 2 weeks, sooner if worsening.     Pharyngitis Pharyngitis is a sore throat (pharynx). There is redness, pain, and swelling of your throat. Follow these instructions at home:  Drink enough fluids to keep your pee (urine) clear or pale yellow.  Only take medicine as told by your doctor.  You may get sick again if you do not take medicine as told. Finish your medicines, even if you start to feel better.  Do not take aspirin.  Rest.  Rinse your mouth (gargle) with salt water ( tsp of salt per 1 qt of water) every 1-2 hours. This will help the pain.  If you are not at risk for choking, you can suck on hard candy or sore throat lozenges. Contact a doctor if:  You have large, tender lumps on your neck.  You have a rash.  You cough up green, yellow-brown, or bloody spit. Get help right away if:  You have a stiff neck.  You drool or cannot swallow liquids.  You throw up (vomit) or are not able to keep medicine or liquids down.  You have very bad pain that does not go away with medicine.  You have problems breathing (not from a stuffy nose). This information is not intended to replace advice given to you by your health care provider. Make sure you discuss any questions you have with your health care provider. Document Released: 11/30/2007 Document Revised: 11/19/2015 Document Reviewed: 02/18/2013 Elsevier Interactive Patient Education  2017 [employer].

## 2016-09-14 NOTE — Progress Notes (Signed)
    Elaine Hall , 07/01/1975, 41 y.o., female MRN: [id number] Patient Care Team    Relationship Specialty Notifications Start End  Tammi Sou, MD PCP - General Family Medicine  01/21/14   Penni Bombard, MD Consulting Physician Neurology  03/08/16     CC: sore throat  Subjective: Pt presents for an  OV with complaints of sore throat of 2 days duration.  Associated symptoms include achy ears, sore throat, swollen glands, fatigue. She denies fever, chills, nausea or vomit. She tried tea, apple cider vinegar and Nyquil. She has a h/o of parotiditis in the past that started with a similar presentation.  No flowsheet data found.  No Known Allergies Social History  Substance Use Topics  . Smoking status: Never Smoker  . Smokeless tobacco: Never Used  . Alcohol use Yes     Comment: socially   Past Medical History:  Diagnosis Date  . Hypothyroidism 09/2013  . Migraine with aura    rizatriptan helpful as abortive med   Past Surgical History:  Procedure Laterality Date  . WISDOM TOOTH EXTRACTION     years ago   Family History  Problem Relation Age of Onset  . Migraines Mother   . Migraines Sister    Allergies as of 09/14/2016   No Known Allergies     Medication List       Accurate as of 09/14/16  1:29 PM. Always use your most recent med list.          levothyroxine 88 MCG tablet Commonly known as:  SYNTHROID, LEVOTHROID Take 88 mcg by mouth daily before breakfast.   rizatriptan 10 MG disintegrating tablet Commonly known as:  MAXALT-MLT Take 1 tablet (10 mg total) by mouth as needed for migraine. May repeat in 2 hours if needed   valACYclovir 1000 MG tablet Commonly known as:  VALTREX       No results found for this or any previous visit (from the past 24 hour(s)). No results found.   ROS: Negative, with the exception of above mentioned in HPI   Objective:  BP 100/67 (BP Location: Right Arm, Patient Position: Sitting, Cuff Size: Normal)   Pulse  67   Temp 98.4 F (36.9 C)   Resp 20   Wt 111 lb 8 oz (50.6 kg)   SpO2 99%   BMI 17.46 kg/m  Body mass index is 17.46 kg/m. Gen: Afebrile. No acute distress. Nontoxic in appearance, well developed, well nourished.  HENT: AT. [place]. Bilateral TM visualized WNL. MMM, no oral lesions. Bilateral nares without erythema. Throat with erythema, no exudates. Hoarseness present.  Eyes:Pupils Equal Round Reactive to light, Extraocular movements intact,  Conjunctiva without redness, discharge or icterus. Neck/lymp/endocrine: Supple,Bilateral submandibular lymphadenopathy CV: RRR  Chest: CTAB, no wheeze or crackles. Abd: Soft. NTND. BS present.  Skin: no rashes, purpura or petechiae.  Neuro: Normal gait. PERLA. EOMi. Alert. Oriented x3   Assessment/Plan: Elaine Hall is a 41 y.o. female present for OV for  Pharyngitis, unspecified etiology Glands swollen - Augmentin prescribed. Pt declined IM depo medrol.  - Rest, hydrate.  - F/u PRN  Reviewed expectations re: course of current medical issues.  Discussed self-management of symptoms.  Outlined signs and symptoms indicating need for more acute intervention.  Patient verbalized understanding and all questions were answered.  Patient received an After-Visit Summary.   electronically signed by:  Howard Pouch, DO  [place]

## 2016-09-15 ENCOUNTER — Ambulatory Visit: Payer: No Typology Code available for payment source | Admitting: Family Medicine

## 2016-09-29 ENCOUNTER — Telehealth: Payer: Self-pay | Admitting: *Deleted

## 2016-09-29 NOTE — Telephone Encounter (Signed)
Unfortunately, she needs to seek care at an Urgent Care or emergency department. If this was strep throat, augmentin would have cured it completely. The fact that she still has such a sore throat could indicate that she has an abscess around one of her tonsils OR even infection of her epiglottis (the area that is the entrance into her trachea), which can be very dangerous as well.--thx

## 2016-09-29 NOTE — Telephone Encounter (Signed)
Patient  was seen by Dr Raoul Pitch on 09/14/16 and given antibiotic . She called today to say she isnt any better and now her throat is so sore she can hardly swallow she would like to know if anything can be called in for her and advice on what to do . She is in [facility] this week so if we can call something in let her know so she can give [place] pharmacy information.

## 2016-09-29 NOTE — Telephone Encounter (Signed)
Patient notified and verbalized understanding.  She is coming back home today and requesting appointment with Dr. Anitra Lauth for tomorrow.  Phone call transferred to Diane to schedule an appointment.

## 2016-09-30 ENCOUNTER — Encounter: Payer: Self-pay | Admitting: Family Medicine

## 2016-09-30 ENCOUNTER — Ambulatory Visit (INDEPENDENT_AMBULATORY_CARE_PROVIDER_SITE_OTHER): Payer: No Typology Code available for payment source | Admitting: Family Medicine

## 2016-09-30 VITALS — BP 105/65 | HR 74 | Temp 97.9°F | Resp 16 | Ht 67.0 in | Wt 110.8 lb

## 2016-09-30 DIAGNOSIS — J111 Influenza due to unidentified influenza virus with other respiratory manifestations: Secondary | ICD-10-CM

## 2016-09-30 NOTE — Progress Notes (Signed)
Pre visit review using our clinic review tool, if applicable. No additional management support is needed unless otherwise documented below in the visit note. 

## 2016-09-30 NOTE — Progress Notes (Signed)
OFFICE VISIT  09/30/2016   CC:  Chief Complaint  Patient presents with  . Sore Throat    x 3-4 days   HPI:    Patient is a 40 y.o. Caucasian female who presents for follow up of recent pharyngitis illness in which she was rx'd a course of augmentin.  This was 16 days ago. Her ST went away after about 7d and she finished the augmentin, essentially felt well for several days.  Then, 4 days ago she developed ST and fatigue, tender submandibular soft tissue region.  Subjective fevers for a couple days.  No cough but nasal congestion and runny nose. No rash.  +HA-mild.  Generalized body aches.  Yesterday she began improving.  Now the main prob's are ST, tender LNs, NEXT to her parotid gland area bilat, and nasal congestion.     Past Medical History:  Diagnosis Date  . Hypothyroidism 09/2013  . Migraine with aura    rizatriptan helpful as abortive med    Past Surgical History:  Procedure Laterality Date  . WISDOM TOOTH EXTRACTION     years ago    Outpatient Medications Prior to Visit  Medication Sig Dispense Refill  . levothyroxine (SYNTHROID, LEVOTHROID) 88 MCG tablet Take 88 mcg by mouth daily before breakfast.    . rizatriptan (MAXALT-MLT) 10 MG disintegrating tablet Take 1 tablet (10 mg total) by mouth as needed for migraine. May repeat in 2 hours if needed 9 tablet 11  . valACYclovir (VALTREX) 1000 MG tablet     . amoxicillin-clavulanate (AUGMENTIN) 875-125 MG tablet Take 1 tablet by mouth 2 (two) times daily. (Patient not taking: Reported on 09/30/2016) 20 tablet 0   No facility-administered medications prior to visit.     No Known Allergies  ROS As per HPI  PE: Blood pressure 105/65, pulse 74, temperature 97.9 F (36.6 C), temperature source Oral, resp. rate 16, height 5\' 7"  (1.702 m), weight 110 lb 12 oz (50.2 kg), last menstrual period 09/19/2016, SpO2 100 %. VS: noted--normal. Gen: alert, NAD, NONTOXIC APPEARING. HEENT: eyes without injection, drainage, or swelling.   Ears: EACs clear, TMs with normal light reflex and landmarks.  Nose: Clear rhinorrhea, with some dried, crusty exudate adherent to mildly injected mucosa.  No purulent d/c.  No paranasal sinus TTP.  No facial swelling.  Throat and mouth without focal lesion.  No pharyngial swelling, erythema, or exudate.   Neck: supple, she has some small, tender LN's in jugulodigastric region bilat, w/out swelling.  No enlargement or tenderness of either submandibular gland or either parotid gland.     LUNGS: CTA bilat, nonlabored resps.   CV: RRR, no m/r/g. EXT: no c/c/e SKIN: no rash  LABS:  none  IMPRESSION AND PLAN:  Influenza-like illness. No sign of submandibular gland infection or stone. She has some tender jugulodigastric LAD which is appropriately reactive. Her symptoms are improving.   She has been ill too long to try tamiflu. Encouraged hydration, rest, otc nonsedating antihistamine + decongestant, saline nasal spray prn. Signs/symptoms to call or return for were reviewed and pt expressed understanding. We discuss/consider prednisone trial for her LN swelling/pain but b/c of questionable efficacy of this med in this case, we decided to let this just run it's course. An After Visit Summary was printed and given to the patient.  FOLLOW UP: Return if symptoms worsen or fail to improve.  Signed:  Crissie Sickles, MD           09/30/2016

## 2016-12-13 ENCOUNTER — Ambulatory Visit: Payer: No Typology Code available for payment source | Admitting: Diagnostic Neuroimaging

## 2017-02-06 ENCOUNTER — Encounter: Payer: Self-pay | Admitting: Diagnostic Neuroimaging

## 2017-02-06 ENCOUNTER — Ambulatory Visit (INDEPENDENT_AMBULATORY_CARE_PROVIDER_SITE_OTHER): Payer: No Typology Code available for payment source | Admitting: Diagnostic Neuroimaging

## 2017-02-06 ENCOUNTER — Ambulatory Visit: Payer: No Typology Code available for payment source | Admitting: Diagnostic Neuroimaging

## 2017-02-06 VITALS — BP 92/54 | Wt 113.4 lb

## 2017-02-06 DIAGNOSIS — G43109 Migraine with aura, not intractable, without status migrainosus: Secondary | ICD-10-CM | POA: Diagnosis not present

## 2017-02-06 MED ORDER — RIZATRIPTAN BENZOATE 10 MG PO TBDP: 10.0000 mg | ORAL_TABLET | ORAL | 11 refills | Status: DC | PRN

## 2017-02-06 NOTE — Progress Notes (Signed)
[facility]  PATIENT: Elaine Hall DOB: [birth year]  REFERRING CLINICIAN: Louretta Shorten HISTORY FROM: patient REASON FOR VISIT: follow up   HISTORICAL  CHIEF COMPLAINT:  Chief Complaint  Patient presents with  . Follow-up    Migraine follow up    HISTORY OF PRESENT ILLNESS:   UPDATE 02/06/17: Since last visit patient doing well. She has only had approximately 8 migraine headaches in the last 11 months. Rizatriptan is helping with migraine treatment. Most of the time she uses ibuprofen first and then rizatriptan if needed. Overall no new issues. Stress levels have improved, which may be helping headaches.  UPDATE 03/07/16: Since last visit doing well on rizatriptan, which helps relive HA. Has had 6 headaches since last visit, less severe. Patient declined MRI due to cost and improving symptoms.  PRIOR HPI (11/18/15): 41 year old female with intermittent headaches. Patient reports one year of intermittent headache starting in the left side of the neck, radiating to the left scalp in left eye. Sometimes associated with blurred vision and sensitivity to light. No significant nausea or vomiting. Sometimes has severe sharp pain with throbbing sensation. Sometimes associated with intermittent left hand numbness. No specific warning. No specific triggering factors. Sometimes patient feels a "knot" in the back of the neck on left side. Patient has tried over-the-counter medications without relief.    REVIEW OF SYSTEMS: Full 14 system review of systems performed and negative with exception of: only as per HPI.   ALLERGIES: No Known Allergies  HOME MEDICATIONS: Outpatient Medications Prior to Visit  Medication Sig Dispense Refill  . levothyroxine (SYNTHROID, LEVOTHROID) 88 MCG tablet Take 88 mcg by mouth daily before breakfast.    . rizatriptan (MAXALT-MLT) 10 MG disintegrating tablet Take 1 tablet (10 mg total) by mouth as needed for migraine. May repeat in 2 hours if  needed 9 tablet 11  . valACYclovir (VALTREX) 1000 MG tablet as needed.      No facility-administered medications prior to visit.     PAST MEDICAL HISTORY: Past Medical History:  Diagnosis Date  . Hypothyroidism 09/2013  . Migraine with aura    rizatriptan helpful as abortive med    PAST SURGICAL HISTORY: Past Surgical History:  Procedure Laterality Date  . WISDOM TOOTH EXTRACTION     years ago    FAMILY HISTORY: Family History  Problem Relation Age of Onset  . Migraines Mother   . Migraines Sister     SOCIAL HISTORY:  Social History   Social History  . Marital status: Married    Spouse name: [name]  . Number of children: 2  . Years of education: 16   Occupational History  .      self employed   Social History Main Topics  . Smoking status: Never Smoker  . Smokeless tobacco: Never Used  . Alcohol use 1.8 oz/week    1 Glasses of wine, 1 Cans of beer, 1 Shots of liquor per week     Comment: socially  . Drug use: No  . Sexual activity: Yes   Other Topics Concern  . Not on file   Social History Narrative   Married, 2 children.   Government social research officer for [employer].   No tob, occ beer or glass of wine.   Exercise: occ treadmill, plays a lot with kids.   Diet: regular american diet.   Caffeine use- soda maybe once a week, chocolate     PHYSICAL EXAM  GENERAL EXAM/CONSTITUTIONAL: Vitals:  Vitals:   02/06/17 1405  BP: (!) 92/54  Weight: 113 lb 6.4 oz (51.4 kg)   Body mass index is 17.76 kg/m. No exam data present  Patient is in no distress; well developed, nourished and groomed; neck is supple  CARDIOVASCULAR:  Examination of carotid arteries is normal; no carotid bruits  Regular rate and rhythm, no murmurs  Examination of peripheral vascular system by observation and palpation is normal  EYES:  Ophthalmoscopic exam of optic discs and posterior segments is normal; no papilledema or hemorrhages  MUSCULOSKELETAL:  Gait, strength,  tone, movements noted in Neurologic exam below  NEUROLOGIC: MENTAL STATUS:  No flowsheet data found.  awake, alert, oriented to person, place and time  recent and remote memory intact  normal attention and concentration  language fluent, comprehension intact, naming intact,   fund of knowledge appropriate  CRANIAL NERVE:   2nd - no papilledema on fundoscopic exam  2nd, 3rd, 4th, 6th - pupils equal and reactive to light, visual fields full to confrontation, extraocular muscles intact, no nystagmus  5th - facial sensation symmetric  7th - facial strength symmetric  8th - hearing intact  9th - palate elevates symmetrically, uvula midline  11th - shoulder shrug symmetric  12th - tongue protrusion midline  MOTOR:   normal bulk and tone, full strength in the BUE, BLE  SENSORY:   normal and symmetric to light touch, temperature, vibration  COORDINATION:   finger-nose-finger, fine finger movements normal  REFLEXES:   deep tendon reflexes present and symmetric  GAIT/STATION:   narrow based gait    DIAGNOSTIC DATA (LABS, IMAGING, TESTING) - I reviewed patient records, labs, notes, testing and imaging myself where available.  Lab Results  Component Value Date   WBC 14.8 (H) 09/01/2007   HGB 8.9 (L) 09/01/2007   HCT 25.9 (L) 09/01/2007   MCV 75.6 (L) 09/01/2007   PLT 231 09/01/2007   No results found for: NA, K, CL, CO2, GLUCOSE, BUN, CREATININE, CALCIUM, PROT, ALBUMIN, AST, ALT, ALKPHOS, BILITOT, GFRNONAA, GFRAA No results found for: CHOL, HDL, LDLCALC, LDLDIRECT, TRIG, CHOLHDL No results found for: HGBA1C No results found for: VITAMINB12 No results found for: TSH  02/11/14 CXR [I reviewed images myself and agree with interpretation. -VRP]  - Hyperinflation and minimal peribronchial thickening which could reflect asthma. - No acute infiltrate.    ASSESSMENT AND PLAN  41 y.o. year old female here with intermittent headaches since 2016, sometimes  associated with left hand numbness and dizziness. Most likely represents migraine phenomenon. Symptoms improving.  Ddx: migraine with aura (likely)  1. Migraine with aura and without status migrainosus, not intractable      PLAN:  I spent 15 minutes of face to face time with patient. Greater than 50% of time was spent in counseling and coordination of care with patient. In summary we discussed:   MIGRAINE WITH AURA - continue rizatriptan 10mg  as needed for breakthrough headache; may repeat x 1 after 2 hours; max 2 tabs per day or 8 per month - may consider migraine preventative med in future (such as amitriptyline); doing well for now - nutrition, fitness, sleep hygiene reviewed  Meds ordered this encounter  Medications  . rizatriptan (MAXALT-MLT) 10 MG disintegrating tablet    Sig: Take 1 tablet (10 mg total) by mouth as needed for migraine. May repeat in 2 hours if needed    Dispense:  9 tablet    Refill:  11   Return in about 1 year (around 02/06/2018) for with Ward Givens or [place].  Penni Bombard, MD 0/15/6153, 7:94 PM Certified in Neurology, Neurophysiology and Neuroimaging  [facility] [address] [place], [place] [address] [phone]

## 2017-02-22 ENCOUNTER — Encounter: Payer: Self-pay | Admitting: Family Medicine

## 2017-08-10 ENCOUNTER — Telehealth: Payer: Self-pay | Admitting: *Deleted

## 2017-08-10 NOTE — Telephone Encounter (Signed)
Pt has appt for annual RV with Dr. Leta Baptist.  I LMVM if she needed earlier appt may could see NP.  I would be out tomorrow but can see next week.

## 2017-08-14 NOTE — Telephone Encounter (Signed)
Pt returned RN's call. An appt was scheduled with Megan on 02/20/18.  FYI

## 2017-08-14 NOTE — Telephone Encounter (Signed)
Noted  

## 2017-10-03 ENCOUNTER — Telehealth: Payer: Self-pay | Admitting: Diagnostic Neuroimaging

## 2017-10-03 NOTE — Telephone Encounter (Signed)
Spoke with patient to advise her the MRI order has expired, was from May 2017. This RN inquired about why she has asked to have an MRI, is she having increased symptoms. The patient stated she and her husband discussed, and she has recently had a friend that complained of headaches, fatigue, had an MRI and has been diagnosed with cancer.  She stated she sometimes has "really bad migraines". She stated she is mostly asking because of her husband and family, "to be on the safe side".  She reported that she has been diagnosed with hypothyroidism as well. This RN advised will discuss with Dr Leta Baptist and call her back. Her next scheduled FU is in Aug. Patient verbalized understanding, appreciation.

## 2017-10-03 NOTE — Telephone Encounter (Signed)
Spoke with patient and advised she must be seen and evaluated before MRI can be ordered. She requested to see NP; rescheduled FU from August to next week. Patient understands to arrive 30 minutes early for check in. She verbalized understanding, appreciation.

## 2017-10-03 NOTE — Telephone Encounter (Signed)
Pt states when she last saw Dr Leta Baptist he suggested a MRI.  Pt would now like to proceed in moving forward with the MRI.  Please have pt scheduled

## 2017-10-10 ENCOUNTER — Telehealth: Payer: Self-pay | Admitting: Adult Health

## 2017-10-10 ENCOUNTER — Encounter: Payer: Self-pay | Admitting: Adult Health

## 2017-10-10 ENCOUNTER — Ambulatory Visit (INDEPENDENT_AMBULATORY_CARE_PROVIDER_SITE_OTHER): Payer: No Typology Code available for payment source | Admitting: Adult Health

## 2017-10-10 VITALS — BP 102/65 | HR 62 | Ht 67.0 in | Wt 112.0 lb

## 2017-10-10 DIAGNOSIS — G43001 Migraine without aura, not intractable, with status migrainosus: Secondary | ICD-10-CM | POA: Diagnosis not present

## 2017-10-10 NOTE — Telephone Encounter (Signed)
[employer]  Auth: [employer] @ 9:06 AM eastern time. Patient is scheduled for 10/11/17 on the [facility] mobile unit.

## 2017-10-10 NOTE — Patient Instructions (Signed)
Your Plan:  Continue maxalt MRI brain  If your symptoms worsen or you develop new symptoms please let us know.    Thank you for coming to see [place] at [facility]. I hope we have been able to provide you high quality care today.  You may receive a patient satisfaction survey over the next few weeks. We would appreciate your feedback and comments so that we may continue to improve ourselves and the health of our patients.

## 2017-10-10 NOTE — Progress Notes (Signed)
PATIENT: Elaine Hall DOB: 04-27-1976  REASON FOR VISIT: follow up HISTORY FROM: patient  HISTORY OF PRESENT ILLNESS: Today 10/10/17 Elaine Hall is a 42 year old female with a history of migraine headaches.  She returns today for follow-up.  She reports that in the last couple months her headache frequency has decreased.  She has approximately 2-4 headaches a month.  They always start in the neck on the left side.  It radiates to the left eye.  She does have photophobia, blurry vision, nausea but no vomiting.  She denies any numbness or tingling in arms or legs.  The patient never had an MRI of the brain.  She denies any new neurological symptoms.  She returns today for evaluation.  HISTORY  UPDATE 02/06/17: Since last visit patient doing well. She has only had approximately 8 migraine headaches in the last 11 months. Rizatriptan is helping with migraine treatment. Most of the time she uses ibuprofen first and then rizatriptan if needed. Overall no new issues. Stress levels have improved, which may be helping headaches.  UPDATE 03/07/16: Since last visit doing well on rizatriptan, which helps relive HA. Has had 6 headaches since last visit, less severe. Patient declined MRI due to cost and improving symptoms.  PRIOR HPI (11/18/15): 42 year old female with intermittent headaches. Patient reports one year of intermittent headache starting in the left side of the neck, radiating to the left scalp in left eye. Sometimes associated with blurred vision and sensitivity to light. No significant nausea or vomiting. Sometimes has severe sharp pain with throbbing sensation. Sometimes associated with intermittent left hand numbness. No specific warning. No specific triggering factors. Sometimes patient feels a "knot" in the back of the neck on left side. Patient has tried over-the-counter medications without relief.     REVIEW OF SYSTEMS: Out of a complete 14 system review of symptoms, the patient  complains only of the following symptoms, and all other reviewed systems are negative.  See HPI  ALLERGIES: No Known Allergies  HOME MEDICATIONS: Outpatient Medications Prior to Visit  Medication Sig Dispense Refill  . levothyroxine (SYNTHROID, LEVOTHROID) 88 MCG tablet Take 88 mcg by mouth daily before breakfast.    . rizatriptan (MAXALT-MLT) 10 MG disintegrating tablet Take 1 tablet (10 mg total) by mouth as needed for migraine. May repeat in 2 hours if needed 9 tablet 11  . valACYclovir (VALTREX) 1000 MG tablet as needed.      No facility-administered medications prior to visit.     PAST MEDICAL HISTORY: Past Medical History:  Diagnosis Date  . Hypothyroidism 09/2013  . Migraine with aura    sometimes with left hand numbness and dizziness--rizatriptan helpful as abortive med    PAST SURGICAL HISTORY: Past Surgical History:  Procedure Laterality Date  . WISDOM TOOTH EXTRACTION     years ago    FAMILY HISTORY: Family History  Problem Relation Age of Onset  . Migraines Mother   . Migraines Sister     SOCIAL HISTORY: Social History   Socioeconomic History  . Marital status: Married    Spouse name: [name]  . Number of children: 2  . Years of education: 61  . Highest education level: Not on file  Occupational History    Comment: self employed  Social Needs  . Financial resource strain: Not on file  . Food insecurity:    Worry: Not on file    Inability: Not on file  . Transportation needs:    Medical: Not on file  Non-medical: Not on file  Tobacco Use  . Smoking status: Never Smoker  . Smokeless tobacco: Never Used  Substance and Sexual Activity  . Alcohol use: Yes    Alcohol/week: 1.8 oz    Types: 1 Glasses of wine, 1 Cans of beer, 1 Shots of liquor per week    Comment: socially  . Drug use: No  . Sexual activity: Yes  Lifestyle  . Physical activity:    Days per week: Not on file    Minutes per session: Not on file  . Stress: Not on file    Relationships  . Social connections:    Talks on phone: Not on file    Gets together: Not on file    Attends religious service: Not on file    Active member of club or organization: Not on file    Attends meetings of clubs or organizations: Not on file    Relationship status: Not on file  . Intimate partner violence:    Fear of current or ex partner: Not on file    Emotionally abused: Not on file    Physically abused: Not on file    Forced sexual activity: Not on file  Other Topics Concern  . Not on file  Social History Narrative   Married, 2 children.   Government social research officer for [employer].   No tob, occ beer or glass of wine.   Exercise: occ treadmill, plays a lot with kids.   Diet: regular american diet.   Caffeine use- soda maybe once a week, chocolate      PHYSICAL EXAM  Vitals:   10/10/17 0811  BP: 102/65  Pulse: 62  Weight: 112 lb (50.8 kg)  Height: 5\' 7"  (1.702 m)   Body mass index is 17.54 kg/m.  Generalized: Well developed, in no acute distress   Neurological examination  Mentation: Alert oriented to time, place, history taking. Follows all commands speech and language fluent Cranial nerve II-XII: Pupils were equal round reactive to light. Extraocular movements were full, visual field were full on confrontational test. Facial sensation and strength were normal. Uvula tongue midline. Head turning and shoulder shrug  were normal and symmetric. Motor: The motor testing reveals 5 over 5 strength of all 4 extremities. Good symmetric motor tone is noted throughout.  Sensory: Sensory testing is intact to soft touch on all 4 extremities. No evidence of extinction is noted.  Coordination: Cerebellar testing reveals good finger-nose-finger and heel-to-shin bilaterally.  Gait and station: Gait is normal. Tandem gait is normal. Romberg is negative. No drift is seen.  Reflexes: Deep tendon reflexes are symmetric and normal bilaterally.   DIAGNOSTIC DATA (LABS,  IMAGING, TESTING) - I reviewed patient records, labs, notes, testing and imaging myself where available.  Lab Results  Component Value Date   WBC 14.8 (H) 09/01/2007   HGB 8.9 (L) 09/01/2007   HCT 25.9 (L) 09/01/2007   MCV 75.6 (L) 09/01/2007   PLT 231 09/01/2007      ASSESSMENT AND PLAN 42 y.o. year old female  has a past medical history of Hypothyroidism (09/2013) and Migraine with aura. here with:  1.  Migraine headache  The patient's headache frequency has increased over the last 2 months.  We will continue the Maxalt for acute treatment.  We will proceed with MRI of the brain to look for potential causes of her headache.  She is advised that if her symptoms worsen or she develops new symptoms she should let us know.  We  will follow-up in 1 year or sooner as needed.   Ward Givens, MSN, NP-C 10/10/2017, 8:23 AM [facility] [address] [place], [place] [address] [phone]

## 2017-10-10 NOTE — Progress Notes (Signed)
I reviewed note and agree with plan.   Penni Bombard, MD 8/97/9150, 4:13 AM Certified in Neurology, Neurophysiology and Neuroimaging  [facility] [address] [place], [place] [address] [phone]

## 2017-10-11 ENCOUNTER — Ambulatory Visit: Payer: No Typology Code available for payment source

## 2017-10-11 DIAGNOSIS — G43001 Migraine without aura, not intractable, with status migrainosus: Secondary | ICD-10-CM

## 2017-10-12 ENCOUNTER — Telehealth: Payer: Self-pay | Admitting: Neurology

## 2017-10-12 NOTE — Telephone Encounter (Signed)
Called the pt to make her aware that MRI of her brain was normal. Pt verbalized understanding. Pt had no questions at this time but was encouraged to call back if questions arise.

## 2017-10-12 NOTE — Telephone Encounter (Signed)
-----   Message from Ward Givens, NP sent at 10/12/2017 10:04 AM EDT ----- MRI is normal. Please call patient

## 2017-11-15 ENCOUNTER — Encounter: Payer: Self-pay | Admitting: Family Medicine

## 2017-12-05 ENCOUNTER — Encounter: Payer: Self-pay | Admitting: Family Medicine

## 2017-12-05 ENCOUNTER — Ambulatory Visit: Payer: No Typology Code available for payment source | Admitting: Family Medicine

## 2017-12-05 NOTE — Progress Notes (Deleted)
OFFICE VISIT  12/05/2017   CC: No chief complaint on file.    HPI:    Patient is a 42 y.o. Caucasian female who presents for sore throat.  Past Medical History:  Diagnosis Date  . Hypothyroidism 09/2013  . Migraine with aura    sometimes with left hand numbness and dizziness--rizatriptan helpful as abortive med.  Normal brain MRI 09/2017.    Past Surgical History:  Procedure Laterality Date  . WISDOM TOOTH EXTRACTION     years ago    Outpatient Medications Prior to Visit  Medication Sig Dispense Refill  . levothyroxine (SYNTHROID, LEVOTHROID) 88 MCG tablet Take 88 mcg by mouth daily before breakfast.    . rizatriptan (MAXALT-MLT) 10 MG disintegrating tablet Take 1 tablet (10 mg total) by mouth as needed for migraine. May repeat in 2 hours if needed 9 tablet 11  . valACYclovir (VALTREX) 1000 MG tablet as needed.      No facility-administered medications prior to visit.     No Known Allergies  ROS As per HPI  PE: There were no vitals taken for this visit. ***  LABS:    Chemistry   No results found for: NA, K, CL, CO2, BUN, CREATININE, GLU No results found for: CALCIUM, ALKPHOS, AST, ALT, BILITOT    IMPRESSION AND PLAN:  No problem-specific Assessment & Plan notes found for this encounter.   FOLLOW UP: No follow-ups on file.

## 2018-02-07 ENCOUNTER — Ambulatory Visit: Payer: No Typology Code available for payment source | Admitting: Diagnostic Neuroimaging

## 2018-02-13 ENCOUNTER — Ambulatory Visit: Payer: No Typology Code available for payment source | Admitting: Diagnostic Neuroimaging

## 2018-02-20 ENCOUNTER — Ambulatory Visit: Payer: No Typology Code available for payment source | Admitting: Adult Health

## 2018-02-24 ENCOUNTER — Other Ambulatory Visit: Payer: Self-pay | Admitting: Diagnostic Neuroimaging

## 2018-10-10 ENCOUNTER — Telehealth: Payer: Self-pay | Admitting: *Deleted

## 2018-10-10 NOTE — Telephone Encounter (Signed)
LMVM for pt to return call to convert appt to VV webex or telephone.

## 2018-10-11 ENCOUNTER — Telehealth: Payer: Self-pay | Admitting: Family Medicine

## 2018-10-11 NOTE — Telephone Encounter (Signed)
Due to current COVID 19 pandemic, our office is severely reducing in office visits for at least the next 2 weeks, in order to minimize the risk to our patients and healthcare providers.  Pt understands that although there may be some limitations with this type of visit, we will take all precautions to reduce any security or privacy concerns.  Pt understands that this will be treated like an in office visit and we will file with pt's insurance. Pt's email is [email or web address] . Pt understands that the [employer] webex software must be downloaded and operational on the device pt plans to use for the visit.  Per note from DW pt consented to webex.

## 2018-10-11 NOTE — Telephone Encounter (Signed)
APPT note updated. Please add to webex calendar.

## 2018-10-11 NOTE — Telephone Encounter (Signed)
Pt consents to a Virtual Visit and for the insurance to be billed as such. Email has been confirmed.

## 2018-10-15 ENCOUNTER — Ambulatory Visit: Payer: No Typology Code available for payment source | Admitting: Adult Health

## 2018-10-15 ENCOUNTER — Other Ambulatory Visit: Payer: Self-pay

## 2018-10-15 ENCOUNTER — Ambulatory Visit (INDEPENDENT_AMBULATORY_CARE_PROVIDER_SITE_OTHER): Payer: No Typology Code available for payment source | Admitting: Family Medicine

## 2018-10-15 ENCOUNTER — Encounter: Payer: Self-pay | Admitting: Family Medicine

## 2018-10-15 DIAGNOSIS — G43001 Migraine without aura, not intractable, with status migrainosus: Secondary | ICD-10-CM | POA: Diagnosis not present

## 2018-10-15 MED ORDER — RIZATRIPTAN BENZOATE 10 MG PO TBDP: ORAL_TABLET | ORAL | 11 refills | Status: DC

## 2018-10-15 NOTE — Progress Notes (Signed)
PATIENT: Elaine Hall DOB: Nov 30, 1975  REASON FOR VISIT: follow up HISTORY FROM: patient  Virtual Visit via Telephone Note  I connected with Elaine Hall on 10/15/18 at  9:00 AM EDT by telephone and verified that I am speaking with the correct person using two identifiers.   I discussed the limitations, risks, security and privacy concerns of performing an evaluation and management service by telephone and the availability of in person appointments. I also discussed with the patient that there may be a patient responsible charge related to this service. The patient expressed understanding and agreed to proceed.   History of Present Illness:  10/15/18 Elaine Hall is a 43 y.o. female for follow up of migraines.  Elaine Hall continues to do well with acute management with rizatriptan as needed.  Over the past 2 months she has noted an increase in frequency of her migraines.  Typically she has 1-2 migraines per month, usually prior to start of menstrual cycle.  Over the past 2 months she has noticed that she is having 3-4 migraines per month.  She feels that this could be related to stress.  She does not feel a preventative is needed at this time.  She reports excellent relief with 1 dose of rizatriptan.  She denies any new symptoms or concerns.   HISTORY (copied from [facility] note on 10/10/2017)  Ms. giaimo is a 43 year old female with a history of migraine headaches. She returns today for follow-up.  She reports that in the last couple months her headache frequency has decreased.  She has approximately 2-4 headaches a month.  They always start in the neck on the left side.  It radiates to the left eye.  She does have photophobia, blurry vision, nausea but no vomiting.  She denies any numbness or tingling in arms or legs. The patient never had an MRI of the brain.  She denies any new neurological symptoms.  She returns today for evaluation.  HISTORY UPDATE 02/06/17:Since last  visit patient doing well. She has only had approximately 8 migraine headaches in the last 11 months. Rizatriptan is helping with migraine treatment. Most of the time she uses ibuprofen first and then rizatriptan if needed. Overall no new issues. Stress levels have improved, which may be helping headaches.  UPDATE 03/07/16: Since last visit doing well on rizatriptan, which helps relive HA. Has had 6 headaches since last visit, less severe. Patient declined MRI due to cost and improving symptoms.  PRIOR HPI (11/18/15): 43 year old female with intermittent headaches. Patient reports one year of intermittent headache starting in the left side of the neck, radiating to the left scalp in left eye. Sometimes associated with blurred vision and sensitivity to light. No significant nausea or vomiting. Sometimes has severe sharp pain with throbbing sensation. Sometimes associated with intermittent left hand numbness. No specific warning. No specific triggering factors. Sometimes patient feels a "knot" in the back of the neck on left side. Patient has tried over-the-counter medications without relief.   Observations/Objective:  Generalized: Well developed, in no acute distress  Mentation: Alert oriented to time, place, history taking. Follows all commands speech and language fluent Motor: moves all extremities fluently.    Assessment and Plan:  43 y.o. year old female  has a past medical history of Hypothyroidism (09/2013) and Migraine with aura. with    ICD-10-CM   1. Migraine without aura and with status migrainosus, not intractable G43.001    Elaine Hall continues to do well with acute management.  We will  continue rizatriptan as prescribed.  We have discussed the potential need for preventative therapy should frequency of migraines continue to worsen.  We have discussed alternative complementary therapy to help with migraines and stress levels such as adequate hydration, healthy diet and regular exercise.   She was advised to reach out should migraines continue to worsen.  Otherwise we will follow-up with her in 1 year.  She verbalizes understanding and agreement with this plan.0  No orders of the defined types were placed in this encounter.   Meds ordered this encounter  Medications  . rizatriptan (MAXALT-MLT) 10 MG disintegrating tablet    Sig: TAKE 1 TABLET BY MOUTH AS NEEDED FOR MIGRAINE. MAY REPEAT IN 2 HOURS IF NEEDED.    Dispense:  9 tablet    Refill:  11    Please fill qty 9    Order Specific Question:   Supervising Provider    Answer:   Melvenia Beam [[id number]]     Follow Up Instructions:  I discussed the assessment and treatment plan with the patient. The patient was provided an opportunity to ask questions and all were answered. The patient agreed with the plan and demonstrated an understanding of the instructions.   The patient was advised to call back or seek an in-person evaluation if the symptoms worsen or if the condition fails to improve as anticipated.  I provided 25 minutes of non-face-to-face time during this encounter.  Patient is located at her place of residence during video conference.  Provider is located at her place of residence.  Liane Comber, RN help to facilitate visit.   Debbora Presto, NP

## 2018-10-15 NOTE — Progress Notes (Signed)
I reviewed note and agree with plan.   Penni Bombard, MD 07/15/4172, 08:14 PM Certified in Neurology, Neurophysiology and Neuroimaging  [facility] [address], [place], [place] [address] [phone]

## 2018-10-22 NOTE — Telephone Encounter (Signed)
Chart updated on 10-11-18.

## 2018-11-08 ENCOUNTER — Telehealth: Payer: Self-pay | Admitting: Family Medicine

## 2018-11-08 NOTE — Telephone Encounter (Signed)
Left message for pt req call bk to schedule 1 yr follow up with Amy (Around 10/15/2019)

## 2019-06-28 DIAGNOSIS — D472 Monoclonal gammopathy: Secondary | ICD-10-CM

## 2019-06-28 HISTORY — DX: Monoclonal gammopathy: D47.2

## 2019-07-30 ENCOUNTER — Telehealth: Payer: Self-pay | Admitting: Hematology

## 2019-07-30 NOTE — Telephone Encounter (Signed)
Received a new hem referral from Dr. Kathlene November for Elaine Hall, R/O MULTIPLE MYELOMA. Pt returned my call and has been cld and scheduled to see Dr. Irene Limbo on 2/4 at 1pm. I provided the address to our office to the pt. She's been made aware to arrive 15 minutes early.

## 2019-08-01 ENCOUNTER — Inpatient Hospital Stay: Payer: No Typology Code available for payment source

## 2019-08-01 ENCOUNTER — Other Ambulatory Visit: Payer: Self-pay

## 2019-08-01 ENCOUNTER — Inpatient Hospital Stay: Payer: No Typology Code available for payment source | Attending: Hematology | Admitting: Hematology

## 2019-08-01 ENCOUNTER — Telehealth: Payer: Self-pay | Admitting: *Deleted

## 2019-08-01 VITALS — BP 100/64 | HR 65 | Temp 98.5°F | Resp 18 | Ht 67.0 in | Wt 104.8 lb

## 2019-08-01 DIAGNOSIS — R779 Abnormality of plasma protein, unspecified: Secondary | ICD-10-CM | POA: Diagnosis present

## 2019-08-01 DIAGNOSIS — D472 Monoclonal gammopathy: Secondary | ICD-10-CM

## 2019-08-01 DIAGNOSIS — E0590 Thyrotoxicosis, unspecified without thyrotoxic crisis or storm: Secondary | ICD-10-CM | POA: Insufficient documentation

## 2019-08-01 DIAGNOSIS — M545 Low back pain: Secondary | ICD-10-CM | POA: Insufficient documentation

## 2019-08-01 DIAGNOSIS — G43909 Migraine, unspecified, not intractable, without status migrainosus: Secondary | ICD-10-CM | POA: Insufficient documentation

## 2019-08-01 DIAGNOSIS — R634 Abnormal weight loss: Secondary | ICD-10-CM

## 2019-08-01 LAB — CMP (CANCER CENTER ONLY)
ALT: 9 U/L (ref 0–44)
AST: 10 U/L — ABNORMAL LOW (ref 15–41)
Albumin: 3.9 g/dL (ref 3.5–5.0)
Alkaline Phosphatase: 46 U/L (ref 38–126)
Anion gap: 9 (ref 5–15)
BUN: 15 mg/dL (ref 6–20)
CO2: 19 mmol/L — ABNORMAL LOW (ref 22–32)
Calcium: 8.6 mg/dL — ABNORMAL LOW (ref 8.9–10.3)
Chloride: 114 mmol/L — ABNORMAL HIGH (ref 98–111)
Creatinine: 1.11 mg/dL — ABNORMAL HIGH (ref 0.44–1.00)
GFR, Est AFR Am: >60 mL/min (ref >60)
GFR, Estimated: >60 mL/min (ref >60)
Glucose, Bld: 87 mg/dL (ref 70–99)
Potassium: 2.9 mmol/L — CL (ref 3.5–5.1)
Sodium: 142 mmol/L (ref 135–145)
Total Bilirubin: 0.3 mg/dL (ref 0.3–1.2)
Total Protein: 9 g/dL — ABNORMAL HIGH (ref 6.5–8.1)

## 2019-08-01 LAB — CBC WITH DIFFERENTIAL/PLATELET
Abs Immature Granulocytes: 0.34 10*3/uL — ABNORMAL HIGH (ref 0.00–0.07)
Basophils Absolute: 0 10*3/uL (ref 0.0–0.1)
Basophils Relative: 1 %
Eosinophils Absolute: 0.1 10*3/uL (ref 0.0–0.5)
Eosinophils Relative: 1 %
HCT: 35.5 % — ABNORMAL LOW (ref 36.0–46.0)
Hemoglobin: 11.7 g/dL — ABNORMAL LOW (ref 12.0–15.0)
Immature Granulocytes: 4 %
Lymphocytes Relative: 15 %
Lymphs Abs: 1.3 10*3/uL (ref 0.7–4.0)
MCH: 29.6 pg (ref 26.0–34.0)
MCHC: 33 g/dL (ref 30.0–36.0)
MCV: 89.9 fL (ref 80.0–100.0)
Monocytes Absolute: 0.7 10*3/uL (ref 0.1–1.0)
Monocytes Relative: 8 %
Neutro Abs: 6.2 10*3/uL (ref 1.7–7.7)
Neutrophils Relative %: 71 %
Platelets: 227 10*3/uL (ref 150–400)
RBC: 3.95 MIL/uL (ref 3.87–5.11)
RDW: 13.9 % (ref 11.5–15.5)
WBC: 8.7 10*3/uL (ref 4.0–10.5)
nRBC: 0 % (ref 0.0–0.2)

## 2019-08-01 LAB — LACTATE DEHYDROGENASE: LDH: 93 U/L — ABNORMAL LOW (ref 98–192)

## 2019-08-01 LAB — SEDIMENTATION RATE: Sed Rate: 39 mm/hr — ABNORMAL HIGH (ref 0–22)

## 2019-08-01 MED ORDER — POTASSIUM CHLORIDE CRYS ER 20 MEQ PO TBCR: EXTENDED_RELEASE_TABLET | ORAL | 0 refills | Status: DC

## 2019-08-01 NOTE — Telephone Encounter (Signed)
Notified by lab/Jay that potassium 2.9 @ 1518. Dr. Irene Limbo informed 1535. Dr. Irene Limbo prescribed a po potassium supplement. Contacted patient to inform her of lab results prescription. Reviewed potassium rich foods with her and she verbalized understanding of importance of taking supplement.

## 2019-08-01 NOTE — Patient Instructions (Signed)
Thank you for choosing [facility] to provide your oncology and hematology care.   Should you have questions after your visit to the [facility] ([facility]), please contact this office at [phone] between 8:30 AM and 4:30 PM.  Voice mails left after 4:00 PM may not be returned until the following business day.  Calls received after 4:30 PM will be answered by an off-site Nurse Triage Line.    Prescription Refills:  Please have your pharmacy contact us directly for most prescription requests.  Contact the office directly for refills of narcotics (pain medications). Allow 48-72 hours for refills.  Appointments: Please contact the [facility] scheduling department [phone] for questions regarding [facility] appointment scheduling.  Contact the schedulers with any scheduling changes so that your appointment can be rescheduled in a timely manner.   Central Scheduling for [place] [phone] - Call to schedule procedures such as PET scans, CT scans, MRI, Ultrasound, etc.  To afford each patient quality time with our providers, please arrive 30 minutes before your scheduled appointment time.  If you arrive late for your appointment, you may be asked to reschedule.  We strive to give you quality time with our providers, and arriving late affects you and other patients whose appointments are after yours. If you are a no show for multiple scheduled visits, you may be dismissed from the clinic at the providers discretion.     Resources: [facility] Social Workers [phone] for additional information on assistance programs or assistance connecting with community support programs   [place] DSS  336-641-3447: Information regarding food stamps, Medicaid, and utility assistance [place] 336-333-6589   [place] [employer]'s shared-ride transportation service for eligible riders who have a disability that prevents them from riding the fixed route bus.   Medicare Rights Center  800-333-4114 Helps people with Medicare understand their rights and benefits, navigate the Medicare system, and secure the quality healthcare they deserve [employer] 800-227-2345 Assists patients locate various types of support and financial assistance Cancer Care: 1-800-813-HOPE (4673) Provides financial assistance, online support groups, medication/co-pay assistance.   Transportation Assistance for appointments at [facility]: Transportation Coordinator [phone]  Again, thank you for choosing [facility] for your care.       

## 2019-08-01 NOTE — Progress Notes (Signed)
HEMATOLOGY/ONCOLOGY CONSULTATION NOTE  Date of Service: 08/01/2019  Patient Care Team: Tammi Sou, MD as PCP - General (Family Medicine) Leta Baptist, Earlean Polka, MD as Consulting Physician (Neurology)  REFERRING PHYSICIAN: McGowen, Adrian Blackwater, MD  CHIEF COMPLAINTS/PURPOSE OF CONSULTATION:  Monoclonal Paraproteinemia   HISTORY OF PRESENTING ILLNESS:   Elaine Hall is a wonderful 44 y.o. female who has been referred to [place] by McGowen, Adrian Blackwater, MD for evaluation and management of Elevated M-Protein. The pt reports that she is doing well overall.   The pt reports that last summer they discovered that there was protien in is urine. Began having kidney pain in June 2020 and saw a urologist that assured her that she was fine.  01/22/2019 protein in urine (dip stick test).  She was given antibiotics to treat possible infection  January 1st she began to feel like she was getting sick.  The next day she went to bed and did not get out of bed for two weeks due to joint pain all over. She had a fever of 103 degrees and headache. Her sister noticed neck swelling. She was unable to open and close or move her hands, knees, and ankles. She had mouth sores all over her mouth and throat.  She began taking 20 mg of prednisone  a week ago from today. She is now taking 10 mg.   Since starting prednisone she no longer has headaches, mouth sores, bone/joint pain.  She is still having pain in lower back on the sides near kidneys. She notices the pain at night and when her bladder is full.  She did have some vomiting and weight loose  She has problems with dry eyes and dry mouth  Most recent lab results (07/24/2019) of CBC is as follows: all values are WNL except for   .  On review of systems, pt reports some small lymph nodes in neck  and denies current sore throat, headaches, bone/joint pain, abdominal pain, no enlarged spleen or liver, leg swelling, skin rashes  and any other symptoms.    On PMHx the pt reports she has hyperthyroidism that was found 5 years ago and migraine headaches    MEDICAL HISTORY:  Past Medical History:  Diagnosis Date  . Hypothyroidism 09/2013  . Migraine with aura    sometimes with left hand numbness and dizziness--rizatriptan helpful as abortive med.  Normal brain MRI 09/2017.     SURGICAL HISTORY: Past Surgical History:  Procedure Laterality Date  . WISDOM TOOTH EXTRACTION     years ago     SOCIAL HISTORY: Social History   Socioeconomic History  . Marital status: Married    Spouse name: Elaine Hall  . Number of children: 2  . Years of education: 30  . Highest education level: Not on file  Occupational History    Comment: self employed  Tobacco Use  . Smoking status: Never Smoker  . Smokeless tobacco: Never Used  Substance and Sexual Activity  . Alcohol use: Yes    Alcohol/week: 3.0 standard drinks    Types: 1 Glasses of wine, 1 Cans of beer, 1 Shots of liquor per week    Comment: socially  . Drug use: No  . Sexual activity: Yes  Other Topics Concern  . Not on file  Social History Narrative   Married, 2 children.   Government social research officer for [employer].   No tob, occ beer or glass of wine.   Exercise: occ treadmill, plays a lot with kids.  Diet: regular american diet.   Caffeine use- soda maybe once a week, chocolate   Social Determinants of Health   Financial Resource Strain:   . Difficulty of Paying Living Expenses: Not on file  Food Insecurity:   . Worried About Charity fundraiser in the Last Year: Not on file  . Ran Out of Food in the Last Year: Not on file  Transportation Needs:   . Lack of Transportation (Medical): Not on file  . Lack of Transportation (Non-Medical): Not on file  Physical Activity:   . Days of Exercise per Week: Not on file  . Minutes of Exercise per Session: Not on file  Stress:   . Feeling of Stress : Not on file  Social Connections:   . Frequency of Communication with Friends and  Family: Not on file  . Frequency of Social Gatherings with Friends and Family: Not on file  . Attends Religious Services: Not on file  . Active Member of Clubs or Organizations: Not on file  . Attends Archivist Meetings: Not on file  . Marital Status: Not on file  Intimate Partner Violence:   . Fear of Current or Ex-Partner: Not on file  . Emotionally Abused: Not on file  . Physically Abused: Not on file  . Sexually Abused: Not on file     FAMILY HISTORY: Family History  Problem Relation Age of Onset  . Migraines Mother   . Migraines Sister      ALLERGIES:   has No Known Allergies.   MEDICATIONS:  Current Outpatient Medications  Medication Sig Dispense Refill  . levothyroxine (SYNTHROID, LEVOTHROID) 88 MCG tablet Take 88 mcg by mouth daily before breakfast.    . rizatriptan (MAXALT-MLT) 10 MG disintegrating tablet TAKE 1 TABLET BY MOUTH AS NEEDED FOR MIGRAINE. MAY REPEAT IN 2 HOURS IF NEEDED. 9 tablet 11  . valACYclovir (VALTREX) 1000 MG tablet as needed.      No current facility-administered medications for this visit.     REVIEW OF SYSTEMS:   A 10+ POINT REVIEW OF SYSTEMS WAS OBTAINED including neurology, dermatology, psychiatry, cardiac, respiratory, lymph, extremities, GI, GU, Musculoskeletal, constitutional, breasts, reproductive, HEENT.  All pertinent positives are noted in the HPI.  All others are negative.    PHYSICAL EXAMINATION: ECOG PERFORMANCE STATUS: 1 - Symptomatic but completely ambulatory  Vitals:   08/01/19 1311  BP: 100/64  Pulse: 65  Resp: 18  Temp: 98.5 F (36.9 C)  SpO2: 100%   Filed Weights   08/01/19 1311  Weight: 104 lb 12.8 oz (47.5 kg)   Body mass index is 16.41 kg/m.  GENERAL:alert, in no acute distress and comfortable SKIN: no acute rashes, no significant lesions EYES: conjunctiva are pink and non-injected, sclera anicteric OROPHARYNX: MMM, no exudates, no oropharyngeal erythema or ulceration NECK: supple, no  JVD LYMPH:  no palpable lymphadenopathy in the cervical, axillary or inguinal regions LUNGS: clear to auscultation b/l with normal respiratory effort HEART: regular rate & rhythm ABDOMEN:  normoactive bowel sounds , non tender, not distended. Extremity: no pedal edema PSYCH: alert & oriented x 3 with fluent speech NEURO: no focal motor/sensory deficits   LABORATORY DATA:  I have reviewed the data as listed  CBC Latest Ref Rng & Units 08/01/2019 09/01/2007 08/31/2007  WBC 4.0 - 10.5 K/uL 8.7 14.8(H) 14.9(H)  Hemoglobin 12.0 - 15.0 g/dL 11.7(L) 8.9(L) 9.7(L)  Hematocrit 36.0 - 46.0 % 35.5(L) 25.9(L) 28.5(L)  Platelets 150 - 400 K/uL 227 231 250  No flowsheet data found.  RADIOGRAPHIC STUDIES: I have personally reviewed the radiological images as listed and agreed with the findings in the report. No results found.   ASSESSMENT & PLAN:  Navah Grondin is a 44 y.o. female with:  1. 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 PLAN: -Discussed patient's most recent labs from 07/24/2019 -Discussed that she possible has an autoimmune condition that could need long term management or a passing viral infection -no symptomology that suggest multiple myeloma  -Discussed that other medical conditions and her response to prednisone suggests autoimmune condition   -Discussed that she does not meet the CRAB criteria for Myeloma based on outside labs. -Will get labs today as noted below -24 hour urine collection for UPEP -Will see back via phone visit in two weeks  . Orders Placed This Encounter  Procedures  . CBC with Differential/Platelet    Standing Status:   Future    Number of Occurrences:   1    Standing Expiration Date:   09/04/2020  . CMP ([facility] only)    Standing Status:   Future    Number of Occurrences:   1    Standing Expiration Date:   07/31/2020  . Multiple Myeloma Panel  (SPEP&IFE w/QIG)    Standing Status:   Future    Number of Occurrences:   1    Standing Expiration Date:   07/31/2020  . Kappa/lambda light chains    Standing Status:   Future    Number of Occurrences:   1    Standing Expiration Date:   09/04/2020  . Sedimentation rate    Standing Status:   Future    Number of Occurrences:   1    Standing Expiration Date:   07/31/2020  . Lactate dehydrogenase    Standing Status:   Future    Number of Occurrences:   1    Standing Expiration Date:   07/31/2020  . Rheumatoid Arthritis Profile    Standing Status:   Future    Number of Occurrences:   1    Standing Expiration Date:   07/31/2020  . Beta 2 microglobulin, serum    Standing Status:   Future    Number of Occurrences:   1    Standing Expiration Date:   07/31/2020  . 24-Hr Ur UPEP/UIFE/Light Chains/TP    Standing Status:   Future    Number of Occurrences:   1    Standing Expiration Date:   07/31/2020    FOLLOW UP Labs today Phone visit with Dr Irene Limbo in 3 weeks    All of the patients questions were answered with apparent satisfaction. The patient knows to call the clinic with any problems, questions or concerns.  I spent 35 minutes counseling the patient face to face. The total time spent in the appointment was  minutes and more than 50% was on counseling and direct patient cares.    Sullivan Lone MD MS AAHIVMS [facility] Hematology/Oncology Physician [facility]  (Office):       [phone] (Work cell):  [phone] (Fax):           [phone]  08/01/2019 10:55 AM  I, Scot Dock, am acting as a scribe for Dr. Sullivan Lone.   .I have reviewed the above documentation for accuracy and completeness, and I agree with the above. Brunetta Genera MD

## 2019-08-02 LAB — KAPPA/LAMBDA LIGHT CHAINS
Kappa free light chain: 54.7 mg/L — ABNORMAL HIGH (ref 3.3–19.4)
Kappa, lambda light chain ratio: 1.35 (ref 0.26–1.65)
Lambda free light chains: 40.5 mg/L — ABNORMAL HIGH (ref 5.7–26.3)

## 2019-08-02 LAB — BETA 2 MICROGLOBULIN, SERUM: Beta-2 Microglobulin: 3.1 mg/L — ABNORMAL HIGH (ref 0.6–2.4)

## 2019-08-03 LAB — RHEUMATOID ARTHRITIS PROFILE
CCP Antibodies IgG/IgA: 7 units (ref 0–19)
Rheumatoid fact SerPl-aCnc: 96.6 IU/mL — ABNORMAL HIGH (ref 0.0–13.9)

## 2019-08-05 ENCOUNTER — Telehealth: Payer: Self-pay | Admitting: Hematology

## 2019-08-05 DIAGNOSIS — R779 Abnormality of plasma protein, unspecified: Secondary | ICD-10-CM | POA: Diagnosis not present

## 2019-08-05 LAB — MULTIPLE MYELOMA PANEL, SERUM
Albumin SerPl Elph-Mcnc: 3.8 g/dL (ref 2.9–4.4)
Albumin/Glob SerPl: 0.9 (ref 0.7–1.7)
Alpha 1: 0.2 g/dL (ref 0.0–0.4)
Alpha2 Glob SerPl Elph-Mcnc: 0.6 g/dL (ref 0.4–1.0)
B-Globulin SerPl Elph-Mcnc: 0.9 g/dL (ref 0.7–1.3)
Gamma Glob SerPl Elph-Mcnc: 3 g/dL — ABNORMAL HIGH (ref 0.4–1.8)
Globulin, Total: 4.7 g/dL — ABNORMAL HIGH (ref 2.2–3.9)
IgA: 364 mg/dL — ABNORMAL HIGH (ref 87–352)
IgG (Immunoglobin G), Serum: 3088 mg/dL — ABNORMAL HIGH (ref 586–1602)
IgM (Immunoglobulin M), Srm: 132 mg/dL (ref 26–217)
Total Protein ELP: 8.5 g/dL (ref 6.0–8.5)

## 2019-08-05 NOTE — Telephone Encounter (Signed)
Scheduled per 02/04 los, patient has been called and notified. 

## 2019-08-06 LAB — UPEP/UIFE/LIGHT CHAINS/TP, 24-HR UR
% BETA, Urine: 41.4 %
ALPHA 1 URINE: 1.8 %
Albumin, U: 4.1 %
Alpha 2, Urine: 9.1 %
Free Kappa Lt Chains,Ur: 399.2 mg/L — ABNORMAL HIGH (ref 0.63–113.79)
Free Kappa/Lambda Ratio: 9.57 (ref 1.03–31.76)
Free Lambda Lt Chains,Ur: 41.73 mg/L — ABNORMAL HIGH (ref 0.47–11.77)
GAMMA GLOBULIN URINE: 43.6 %
Total Protein, Urine-Ur/day: 157 mg/24 hr — ABNORMAL HIGH (ref 30–150)
Total Protein, Urine: 9.5 mg/dL
Total Volume: 1650

## 2019-08-13 ENCOUNTER — Encounter: Payer: Self-pay | Admitting: Family Medicine

## 2019-08-22 ENCOUNTER — Inpatient Hospital Stay (HOSPITAL_BASED_OUTPATIENT_CLINIC_OR_DEPARTMENT_OTHER): Payer: No Typology Code available for payment source | Admitting: Hematology

## 2019-08-22 DIAGNOSIS — D890 Polyclonal hypergammaglobulinemia: Secondary | ICD-10-CM | POA: Diagnosis not present

## 2019-08-22 DIAGNOSIS — R779 Abnormality of plasma protein, unspecified: Secondary | ICD-10-CM | POA: Diagnosis not present

## 2019-08-22 NOTE — Progress Notes (Signed)
HEMATOLOGY/ONCOLOGY CONSULTATION NOTE  Date of Service: 08/22/2019  Patient Care Team: Tammi Sou, MD as PCP - General (Family Medicine) Leta Baptist, Earlean Polka, MD as Consulting Physician (Neurology) Brunetta Genera, MD as Consulting Physician (Hematology)  REFERRING PHYSICIAN: McGowen, Adrian Blackwater, MD  CHIEF COMPLAINTS/PURPOSE OF CONSULTATION:  Monoclonal Paraproteinemia   HISTORY OF PRESENTING ILLNESS:   Elaine Hall is a wonderful 44 y.o. female who has been referred to [place] by McGowen, Adrian Blackwater, MD for evaluation and management of Elevated M-Protein. The pt reports that she is doing well overall.   The pt reports that last summer they discovered that there was protien in is urine. Began having kidney pain in June 2020 and saw a urologist that assured her that she was fine.  01/22/2019 protein in urine (dip stick test).  She was given antibiotics to treat possible infection  January 1st she began to feel like she was getting sick.  The next day she went to bed and did not get out of bed for two weeks due to joint pain all over. She had a fever of 103 degrees and headache. Her sister noticed neck swelling. She was unable to open and close or move her hands, knees, and ankles. She had mouth sores all over her mouth and throat.  She began taking 20 mg of prednisone  a week ago from today. She is now taking 10 mg.   Since starting prednisone she no longer has headaches, mouth sores, bone/joint pain.  She is still having pain in lower back on the sides near kidneys. She notices the pain at night and when her bladder is full.  She did have some vomiting and weight loose  She has problems with dry eyes and dry mouth  Most recent lab results (07/24/2019) of CBC is as follows: all values are WNL except for   .  On review of systems, pt reports some small lymph nodes in neck  and denies current sore throat, headaches, bone/joint pain, abdominal pain, no enlarged spleen  or liver, leg swelling, skin rashes  and any other symptoms.   On PMHx the pt reports she has hyperthyroidism that was found 5 years ago and migraine headaches  INTERVAL HISTORY:  I connected with  Elaine Hall on 08/22/19 by telephone and verified that I am speaking with the correct person using two identifiers.   I discussed the limitations of evaluation and management by telemedicine. The patient expressed understanding and agreed to proceed.  Other persons participating in the visit and their role in the encounter:       -Yevette Edwards, [place], Medical Scribe  Patient's location: Home Provider's location: [facility] is a wonderful 44 y.o. female who is here for evaluation and management of Elevated M-Protein. The patient's last visit with [place] was on 08/01/2019. The pt reports that she is doing well overall.  The pt reports she is doing well. She is taking Potassium, Plaquenil, and 10 mg Prednisone daily. She reports that her joint stiffness is doing better. She is currently seeing Rheumatologist, Dr. Kathlene November.   Lab results (08/01/19) of CBC w/diff and CMP is as follows: all values are WNL except for Hemoglobin at 11.7, HCT at 35.5,  Abs Immature Granulocytes at 0.34K, Potassium at 2.9, Chloride at 114, CO2 at 19, Creatine at 1.11,Calcium at 8.6, Total Protein at 9.0, and AST at 10.  08/01/2019 LDH at  93  08/01/2019 Sed Rate at 39 08/01/2019 Beta 2 Microglobulin at 3.1 08/01/2019 RA Panel is as follows: Rheumatoid fact SerPl aCnc at 96.6  08/01/2019 K/L light chain ratio is as follows: Kappa free light chain at 54.7, and  Lamda free light chains at 40.5 08/05/2019 24-hr Ur UPEP is as follows: all values are WNL except for Total Protein Urine Ur/day at 157, Free Kappa Lt Chains, Ur at 399.20, and Free Lambda Lt Chains, Ur at 41.73  08/01/2019 MMP is as follows: all values are WNL except for IgG at 3088, IgA at 364, Gamma Glob  SerPl Elph-Mcnc at 3.0, Globulin, and Total at 4.7.   On review of systems, pt reports improving joint pain and denies any other symptoms.   MEDICAL HISTORY:  Past Medical History:  Diagnosis Date  . Hypothyroidism 09/2013  . Migraine with aura    sometimes with left hand numbness and dizziness--rizatriptan helpful as abortive med.  Normal brain MRI 09/2017.  . Monoclonal paraproteinemia 2021   in the context of recent febrile illness w/arthritis and oral ulcers->suggestive of autoimmune etiology.  Dr. Irene Limbo doing labs/urine as of 08/12/19.  . Polyarthritis    Polyarthralgia/febrile illness 06/2019: ? autoimmune? ?viral/postviral?     SURGICAL HISTORY: Past Surgical History:  Procedure Laterality Date  . WISDOM TOOTH EXTRACTION     years ago     SOCIAL HISTORY: Social History   Socioeconomic History  . Marital status: Married    Spouse name: Elaine Hall  . Number of children: 2  . Years of education: 70  . Highest education level: Not on file  Occupational History    Comment: self employed  Tobacco Use  . Smoking status: Never Smoker  . Smokeless tobacco: Never Used  Substance and Sexual Activity  . Alcohol use: Yes    Alcohol/week: 3.0 standard drinks    Types: 1 Glasses of wine, 1 Cans of beer, 1 Shots of liquor per week    Comment: socially  . Drug use: No  . Sexual activity: Yes  Other Topics Concern  . Not on file  Social History Narrative   Married, 2 children.   Government social research officer for [employer].   No tob, occ beer or glass of wine.   Exercise: occ treadmill, plays a lot with kids.   Diet: regular american diet.   Caffeine use- soda maybe once a week, chocolate   Social Determinants of Health   Financial Resource Strain:   . Difficulty of Paying Living Expenses: Not on file  Food Insecurity:   . Worried About Charity fundraiser in the Last Year: Not on file  . Ran Out of Food in the Last Year: Not on file  Transportation Needs:   . Lack of  Transportation (Medical): Not on file  . Lack of Transportation (Non-Medical): Not on file  Physical Activity:   . Days of Exercise per Week: Not on file  . Minutes of Exercise per Session: Not on file  Stress:   . Feeling of Stress : Not on file  Social Connections:   . Frequency of Communication with Friends and Family: Not on file  . Frequency of Social Gatherings with Friends and Family: Not on file  . Attends Religious Services: Not on file  . Active Member of Clubs or Organizations: Not on file  . Attends Archivist Meetings: Not on file  . Marital Status: Not on file  Intimate Partner Violence:   . Fear of Current or Ex-Partner:  Not on file  . Emotionally Abused: Not on file  . Physically Abused: Not on file  . Sexually Abused: Not on file     FAMILY HISTORY: Family History  Problem Relation Age of Onset  . Migraines Mother   . Migraines Sister      ALLERGIES:   has No Known Allergies.   MEDICATIONS:  Current Outpatient Medications  Medication Sig Dispense Refill  . amoxicillin-clavulanate (AUGMENTIN) 875-125 MG tablet SMARTSIG:1 Tablet(s) By Mouth Every 12 Hours    . levothyroxine (SYNTHROID, LEVOTHROID) 88 MCG tablet Take 88 mcg by mouth daily before breakfast.    . nitrofurantoin, macrocrystal-monohydrate, (MACROBID) 100 MG capsule nitrofurantoin monohydrate/macrocrystals 100 mg capsule  TAKE 1 CAPSULE BY MOUTH EVERY 12 HOURS FOR 7 DAYS    . potassium chloride SA (KLOR-CON) 20 MEQ tablet 2 tabs (66meq) twice daily for 3 days then 1 tab twice daily for 2 weeks. Recheck labs with PCP in 2 weeks. 60 tablet 0  . predniSONE (DELTASONE) 5 MG tablet     . rizatriptan (MAXALT-MLT) 10 MG disintegrating tablet TAKE 1 TABLET BY MOUTH AS NEEDED FOR MIGRAINE. MAY REPEAT IN 2 HOURS IF NEEDED. 9 tablet 11  . valACYclovir (VALTREX) 1000 MG tablet as needed.      No current facility-administered medications for this visit.     REVIEW OF SYSTEMS:   A 10+ POINT  REVIEW OF SYSTEMS WAS OBTAINED including neurology, dermatology, psychiatry, cardiac, respiratory, lymph, extremities, GI, GU, Musculoskeletal, constitutional, breasts, reproductive, HEENT.  All pertinent positives are noted in the HPI.  All others are negative.   PHYSICAL EXAMINATION: Telehealth visit  LABORATORY DATA:  I have reviewed the data as listed  CBC Latest Ref Rng & Units 08/01/2019 09/01/2007 08/31/2007  WBC 4.0 - 10.5 K/uL 8.7 14.8(H) 14.9(H)  Hemoglobin 12.0 - 15.0 g/dL 11.7(L) 8.9(L) 9.7(L)  Hematocrit 36.0 - 46.0 % 35.5(L) 25.9(L) 28.5(L)  Platelets 150 - 400 K/uL 227 231 250    CMP Latest Ref Rng & Units 08/01/2019  Glucose 70 - 99 mg/dL 87  BUN 6 - 20 mg/dL 15  Creatinine 0.44 - 1.00 mg/dL 1.11(H)  Sodium 135 - 145 mmol/L 142  Potassium 3.5 - 5.1 mmol/L 2.9(LL)  Chloride 98 - 111 mmol/L 114(H)  CO2 22 - 32 mmol/L 19(L)  Calcium 8.9 - 10.3 mg/dL 8.6(L)  Total Protein 6.5 - 8.1 g/dL 9.0(H)  Total Bilirubin 0.3 - 1.2 mg/dL 0.3  Alkaline Phos 38 - 126 U/L 46  AST 15 - 41 U/L 10(L)  ALT 0 - 44 U/L 9    RADIOGRAPHIC STUDIES: I have personally reviewed the radiological images as listed and agreed with the findings in the report. No results found.   ASSESSMENT & PLAN:   Maron Entwisle is a 44 y.o. female with:  1. 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 -Discussed 08/01/2019 MMP shows IgG at IgG at 3088 and no M Spike  -Discussed 08/01/2019 K/L light chain ratio is as follows: Kappa free light chain at 54.7, and  Lamda free light chains at 40.5 -Advised pt that based on her labs and symptom improvement with  Prednisone and Plaquenil there is a concern of rheumatoid arthritis or  autoimmune inflammatory condition -Advised pt that high rheumatoid factor and elevated monoclonal protein does not suggest a primary bone marrow disorder -Low potassium is likely due to  Prednisone  -Recommend eating potassium rich foods, continue potassium supplement  -Follow up with primary doctor or Dr. Kathlene November to re-check on potassium levels and discuss potassium supplementation -Will see back as needed  . No orders of the defined types were placed in this encounter.   FOLLOW UP RTC with Dr Irene Limbo as needed    The total time spent in the appt was 15 minutes and more than 50% was on counseling and direct patient cares.  All of the patient's questions were answered with apparent satisfaction. The patient knows to call the clinic with any problems, questions or concerns.    Sullivan Lone MD [place] AAHIVMS [facility] Hematology/Oncology Physician [facility]  (Office):       [phone] (Work cell):  [phone] (Fax):           [phone]  08/22/2019 9:56 AM  I, Yevette Edwards, am acting as a scribe for Dr. Sullivan Lone.   .I have reviewed the above documentation for accuracy and completeness, and I agree with the above. Brunetta Genera MD

## 2019-09-10 ENCOUNTER — Encounter: Payer: Self-pay | Admitting: Family Medicine

## 2019-11-20 ENCOUNTER — Telehealth: Payer: Self-pay | Admitting: Family Medicine

## 2019-11-20 MED ORDER — RIZATRIPTAN BENZOATE 10 MG PO TBDP: ORAL_TABLET | ORAL | 0 refills | Status: DC

## 2019-11-20 NOTE — Telephone Encounter (Signed)
I called pt and LMVM for her to return call ok to have VV (mychart) if doing ok,  Can refill maxalt until appt.  She is to call back to schedule.

## 2019-11-20 NOTE — Telephone Encounter (Signed)
Pt called wanting to know if her next appt can be virtual. Pt states that her last appt was virtual and it has been over a year that she has been seen in office. Pt states that nothing has changed she just needs her rizatriptan (MAXALT-MLT) 10 MG disintegrating tablet refilled. Please advise.

## 2019-12-03 ENCOUNTER — Other Ambulatory Visit (HOSPITAL_COMMUNITY): Payer: Self-pay | Admitting: Nephrology

## 2019-12-03 DIAGNOSIS — N1831 Chronic kidney disease, stage 3a: Secondary | ICD-10-CM

## 2019-12-10 ENCOUNTER — Encounter (HOSPITAL_COMMUNITY): Payer: Self-pay

## 2019-12-10 ENCOUNTER — Ambulatory Visit (HOSPITAL_COMMUNITY): Payer: No Typology Code available for payment source

## 2020-02-24 ENCOUNTER — Telehealth (INDEPENDENT_AMBULATORY_CARE_PROVIDER_SITE_OTHER): Payer: No Typology Code available for payment source | Admitting: Family Medicine

## 2020-02-24 DIAGNOSIS — G43001 Migraine without aura, not intractable, with status migrainosus: Secondary | ICD-10-CM | POA: Diagnosis not present

## 2020-02-24 MED ORDER — RIZATRIPTAN BENZOATE 10 MG PO TBDP: ORAL_TABLET | ORAL | 11 refills | Status: DC

## 2020-02-24 NOTE — Progress Notes (Signed)
PATIENT: Elaine Hall DOB: 11-06-1975  REASON FOR VISIT: follow up HISTORY FROM: patient  Virtual Visit via Telephone Note  I connected with Elaine Hall on 02/25/20 at  9:30 AM EDT by telephone and verified that I am speaking with the correct person using two identifiers.   I discussed the limitations, risks, security and privacy concerns of performing an evaluation and management service by telephone and the availability of in person appointments. I also discussed with the patient that there may be a patient responsible charge related to this service. The patient expressed understanding and agreed to proceed.   History of Present Illness:  02/25/20 Elaine Hall is a 44 y.o. female here today for follow up for migraines. She is doing fairly well. She has noted more migraines over the past month due to heat and stress. Her husband recently lost his father and they have been traveling to [place] frequently. Migraines are easily aborted with rizatriptan. She is taking 1-4 per month. Otherwise, she is doing very well.   History (copied from my note on 10/15/2018)  Elaine Hall is a 44 y.o. female for follow up of migraines.  Elaine Hall continues to do well with acute management with rizatriptan as needed.  Over the past 2 months she has noted an increase in frequency of her migraines.  Typically she has 1-2 migraines per month, usually prior to start of menstrual cycle.  Over the past 2 months she has noticed that she is having 3-4 migraines per month.  She feels that this could be related to stress.  She does not feel a preventative is needed at this time.  She reports excellent relief with 1 dose of rizatriptan.  She denies any new symptoms or concerns.   HISTORY (copied from [place] note on 10/10/2017)  Elaine Hall a 44 year old female with a history of migraine headaches.She returns today for follow-up. She reports that in the last couple months her headache  frequency has decreased. She has approximately 2-4 headaches a month. They always start in the neck on the left side. It radiates to the left eye. She does have photophobia, blurry vision, nausea but no vomiting. She denies any numbness or tingling in arms or legs. The patient never had an MRI of the brain. She denies any new neurological symptoms. She returns today for evaluation.  HISTORY UPDATE 02/06/17:Since last visit patient doing well. She has only had approximately 8 migraine headaches in the last 11 months. Rizatriptan is helping with migraine treatment. Most of the time she uses ibuprofen first and then rizatriptan if needed. Overall no new issues. Stress levels have improved, which may be helping headaches.  UPDATE 03/07/16: Since last visit doing well on rizatriptan, which helps relive HA. Has had 6 headaches since last visit, less severe. Patient declined MRI due to cost and improving symptoms.  PRIOR HPI (11/18/15): 44 year old female with intermittent headaches. Patient reports one year of intermittent headache starting in the left side of the neck, radiating to the left scalp in left eye. Sometimes associated with blurred vision and sensitivity to light. No significant nausea or vomiting. Sometimes has severe sharp pain with throbbing sensation. Sometimes associated with intermittent left hand numbness. No specific warning. No specific triggering factors. Sometimes patient feels a "knot" in the back of the neck on left side. Patient has tried over-the-counter medications without relief.    Observations/Objective:  Generalized: Well developed, in no acute distress  Mentation: Alert oriented to time, place, history taking. Follows all commands  speech and language fluent   Assessment and Plan:  44 y.o. year old female  has a past medical history of Hypothyroidism (09/2013), Inflammatory arthritis, Migraine with aura, and Monoclonal paraproteinemia (2021). here with     ICD-10-CM   1. Migraine without aura and with status migrainosus, not intractable  G43.001      Elaine Hall is doing well. Migraines wax and wane but, overall, are well managed acutely with rizatriptan. We will continue current treatment plan. She was encouraged to focus on healthy lifestyle habits. She will follow up with me annually, sooner if needed. She may return to PCP if headaches remain stable. She verbalizes understanding and agreement with this plan.   No orders of the defined types were placed in this encounter.   Meds ordered this encounter  Medications  . rizatriptan (MAXALT-MLT) 10 MG disintegrating tablet    Sig: TAKE 1 TABLET BY MOUTH AS NEEDED FOR MIGRAINE. MAY REPEAT IN 2 HOURS IF NEEDED.    Dispense:  9 tablet    Refill:  11    Please fill qty 9    Order Specific Question:   Supervising Provider    Answer:   Melvenia Beam [[id number]]     Follow Up Instructions:  I discussed the assessment and treatment plan with the patient. The patient was provided an opportunity to ask questions and all were answered. The patient agreed with the plan and demonstrated an understanding of the instructions.   The patient was advised to call back or seek an in-person evaluation if the symptoms worsen or if the condition fails to improve as anticipated.  I provided 15 minutes of non-face-to-face time during this encounter. Patient located at her place of residence. Provider is in the office.    Debbora Presto, NP

## 2020-02-25 ENCOUNTER — Encounter: Payer: Self-pay | Admitting: Family Medicine

## 2020-02-25 NOTE — Progress Notes (Signed)
I reviewed note and agree with plan.   Penni Bombard, MD 02/17/2997, 0:69 PM Certified in Neurology, Neurophysiology and Neuroimaging  [facility] [address] [place], [place] [address] [phone]

## 2021-02-24 ENCOUNTER — Telehealth: Payer: Self-pay

## 2021-02-24 NOTE — Telephone Encounter (Signed)
Called and LVM. Per Amy, can we switch her appt for tomorrow 9/1 to a Mychart visit vs telephone. She prefers to see her pt.   If pt calls back and is ok, please switch.

## 2021-02-24 NOTE — Patient Instructions (Signed)
Below is our plan:  We will continue rizatriptan as needed for abortive therapy.   Please make sure you are staying well hydrated. I recommend 50-60 ounces daily. Well balanced diet and regular exercise encouraged. Consistent sleep schedule with 6-8 hours recommended.   Please continue follow up with care team as directed.   Follow up with me in year, may also follow up with PCP for refills and see me as needed.   You may receive a survey regarding today's visit. I encourage you to leave honest feed back as I do use this information to improve patient care. Thank you for seeing me today!

## 2021-02-24 NOTE — Progress Notes (Addendum)
PATIENT: Elaine Hall DOB: 02/25/1976  REASON FOR VISIT: follow up HISTORY FROM: patient  Virtual Visit via Telephone Note  I connected with Elaine Hall on 02/25/2021 at  9:30 AM EDT by telephone and verified that I am speaking with the correct person using two identifiers.   I discussed the limitations, risks, security and privacy concerns of performing an evaluation and management service by telephone and the availability of in person appointments. I also discussed with the patient that there may be a patient responsible charge related to this service. The patient expressed understanding and agreed to proceed.   History of Present Illness:  02/25/2021 ALL: Elaine Hall returns for follow up for migraines. She continues rizatriptan for abortive therapy. She is doing well. Maybe 1-2 migraines per month. She is followed by endocrinology closely. Has not seen PCP in about 4 years.   02/24/2020 ALL:  Elaine Hall is a 45 y.o. female here today for follow up for migraines. She is doing fairly well. She has noted more migraines over the past month due to heat and stress. Her husband recently lost his father and they have been traveling to [place] frequently. Migraines are easily aborted with rizatriptan. She is taking 1-4 per month. Otherwise, she is doing very well.   History (copied from my note on 10/15/2018)  Elaine Hall is a 45 y.o. female for follow up of migraines.  Elaine Hall continues to do well with acute management with rizatriptan as needed.  Over the past 2 months she has noted an increase in frequency of her migraines.  Typically she has 1-2 migraines per month, usually prior to start of menstrual cycle.  Over the past 2 months she has noticed that she is having 3-4 migraines per month.  She feels that this could be related to stress.  She does not feel a preventative is needed at this time.  She reports excellent relief with 1 dose of rizatriptan.  She denies any new symptoms  or concerns.     HISTORY (copied from [place] note on 10/10/2017)   Elaine Hall is a 45 year old female with a history of migraine headaches. She returns today for follow-up.  She reports that in the last couple months her headache frequency has decreased.  She has approximately 2-4 headaches a month.  They always start in the neck on the left side.  It radiates to the left eye.  She does have photophobia, blurry vision, nausea but no vomiting.  She denies any numbness or tingling in arms or legs. The patient never had an MRI of the brain.  She denies any new neurological symptoms.  She returns today for evaluation.   HISTORY UPDATE 02/06/17: Since last visit patient doing well. She has only had approximately 8 migraine headaches in the last 11 months. Rizatriptan is helping with migraine treatment. Most of the time she uses ibuprofen first and then rizatriptan if needed. Overall no new issues. Stress levels have improved, which may be helping headaches.   UPDATE 03/07/16: Since last visit doing well on rizatriptan, which helps relive HA. Has had 6 headaches since last visit, less severe. Patient declined MRI due to cost and improving symptoms.   PRIOR HPI (11/18/15): 45 year old female with intermittent headaches. Patient reports one year of intermittent headache starting in the left side of the neck, radiating to the left scalp in left eye. Sometimes associated with blurred vision and sensitivity to light. No significant nausea or vomiting. Sometimes has severe sharp pain with throbbing sensation.  Sometimes associated with intermittent left hand numbness. No specific warning. No specific triggering factors. Sometimes patient feels a "knot" in the back of the neck on left side. Patient has tried over-the-counter medications without relief.    Observations/Objective:  Generalized: Well developed, in no acute distress  Mentation: Alert oriented to time, place, history taking. Follows all  commands speech and language fluent   Assessment and Plan:  44 y.o. year old female  has a past medical history of Hypothyroidism (09/2013), Inflammatory arthritis, Migraine with aura, and Monoclonal paraproteinemia (2021). here with    ICD-10-CM   1. Migraine without aura and with status migrainosus, not intractable  G43.001         Elaine Hall is doing well. Migraines wax and wane but, overall, are well managed acutely with rizatriptan. We will continue current treatment plan. She was encouraged to focus on healthy lifestyle habits. She will follow up with me annually, sooner if needed. She may return to PCP if headaches remain stable. She verbalizes understanding and agreement with this plan.   No orders of the defined types were placed in this encounter.   No orders of the defined types were placed in this encounter.    Follow Up Instructions:  I discussed the assessment and treatment plan with the patient. The patient was provided an opportunity to ask questions and all were answered. The patient agreed with the plan and demonstrated an understanding of the instructions.   The patient was advised to call back or seek an in-person evaluation if the symptoms worsen or if the condition fails to improve as anticipated.  I provided 15 minutes of non-face-to-face time during this encounter. Patient located at her place of residence. Provider is in the office.    Debbora Presto, NP

## 2021-02-25 ENCOUNTER — Other Ambulatory Visit: Payer: Self-pay

## 2021-02-25 ENCOUNTER — Telehealth (INDEPENDENT_AMBULATORY_CARE_PROVIDER_SITE_OTHER): Payer: No Typology Code available for payment source | Admitting: Family Medicine

## 2021-02-25 ENCOUNTER — Encounter: Payer: Self-pay | Admitting: Family Medicine

## 2021-02-25 DIAGNOSIS — G43001 Migraine without aura, not intractable, with status migrainosus: Secondary | ICD-10-CM

## 2021-02-25 MED ORDER — RIZATRIPTAN BENZOATE 10 MG PO TBDP: ORAL_TABLET | ORAL | 11 refills | Status: AC

## 2021-06-10 ENCOUNTER — Other Ambulatory Visit: Payer: Self-pay | Admitting: Obstetrics and Gynecology

## 2021-06-10 DIAGNOSIS — R928 Other abnormal and inconclusive findings on diagnostic imaging of breast: Secondary | ICD-10-CM

## 2021-07-22 ENCOUNTER — Ambulatory Visit: Payer: No Typology Code available for payment source

## 2021-07-22 ENCOUNTER — Ambulatory Visit
Admission: RE | Admit: 2021-07-22 | Discharge: 2021-07-22 | Disposition: A | Discharge status: Home / Self Care | Payer: No Typology Code available for payment source | Source: Ambulatory Visit | Attending: Obstetrics and Gynecology | Admitting: Obstetrics and Gynecology

## 2021-07-22 DIAGNOSIS — R928 Other abnormal and inconclusive findings on diagnostic imaging of breast: Secondary | ICD-10-CM

## 2021-07-22 LAB — HM MAMMOGRAPHY

## 2021-08-04 ENCOUNTER — Other Ambulatory Visit: Payer: No Typology Code available for payment source

## 2022-08-08 ENCOUNTER — Encounter: Payer: Self-pay | Admitting: Hematology

## 2022-09-26 LAB — HM PAP SMEAR: Pap: NEGATIVE

## 2022-11-10 ENCOUNTER — Ambulatory Visit: Payer: No Typology Code available for payment source | Admitting: Family Medicine

## 2022-11-17 ENCOUNTER — Telehealth: Payer: Self-pay

## 2022-11-17 NOTE — Telephone Encounter (Signed)
Good afternoon,  An appointment was scheduled for this patient for requested labs. She has not been seen since 2018. She would be considered re-establishing care. Is patient aware of this?

## 2022-11-18 NOTE — Telephone Encounter (Signed)
Noted  

## 2022-11-24 ENCOUNTER — Encounter: Payer: Self-pay | Admitting: Family Medicine

## 2022-11-24 ENCOUNTER — Ambulatory Visit (INDEPENDENT_AMBULATORY_CARE_PROVIDER_SITE_OTHER): Payer: No Typology Code available for payment source | Admitting: Family Medicine

## 2022-11-24 VITALS — BP 107/74 | HR 75 | Ht 68.0 in | Wt 107.8 lb

## 2022-11-24 DIAGNOSIS — M3500 Sicca syndrome, unspecified: Secondary | ICD-10-CM | POA: Diagnosis not present

## 2022-11-24 DIAGNOSIS — R634 Abnormal weight loss: Secondary | ICD-10-CM

## 2022-11-24 DIAGNOSIS — M1990 Unspecified osteoarthritis, unspecified site: Secondary | ICD-10-CM | POA: Diagnosis not present

## 2022-11-24 DIAGNOSIS — R5382 Chronic fatigue, unspecified: Secondary | ICD-10-CM

## 2022-11-24 DIAGNOSIS — E039 Hypothyroidism, unspecified: Secondary | ICD-10-CM

## 2022-11-24 DIAGNOSIS — M1380 Other specified arthritis, unspecified site: Secondary | ICD-10-CM

## 2022-11-24 LAB — COMPREHENSIVE METABOLIC PANEL
ALT: 10 U/L (ref 0–35)
AST: 15 U/L (ref 0–37)
Albumin: 4.2 g/dL (ref 3.5–5.2)
Alkaline Phosphatase: 52 U/L (ref 39–117)
BUN: 17 mg/dL (ref 6–23)
CO2: 20 mEq/L (ref 19–32)
Calcium: 9 mg/dL (ref 8.4–10.5)
Chloride: 111 mEq/L (ref 96–112)
Creatinine, Ser: 1.19 mg/dL (ref 0.40–1.20)
GFR: 55.61 mL/min — ABNORMAL LOW (ref >60.00)
Glucose, Bld: 69 mg/dL — ABNORMAL LOW (ref 70–99)
Potassium: 3.2 mEq/L — ABNORMAL LOW (ref 3.5–5.1)
Sodium: 138 mEq/L (ref 135–145)
Total Bilirubin: 0.4 mg/dL (ref 0.2–1.2)
Total Protein: 8.8 g/dL — ABNORMAL HIGH (ref 6.0–8.3)

## 2022-11-24 LAB — CBC WITH DIFFERENTIAL/PLATELET
Basophils Absolute: 0 10*3/uL (ref 0.0–0.1)
Basophils Relative: 0.5 % (ref 0.0–3.0)
Eosinophils Absolute: 0.2 10*3/uL (ref 0.0–0.7)
Eosinophils Relative: 3.7 % (ref 0.0–5.0)
HCT: 35.4 % — ABNORMAL LOW (ref 36.0–46.0)
Hemoglobin: 11.8 g/dL — ABNORMAL LOW (ref 12.0–15.0)
Lymphocytes Relative: 22.8 % (ref 12.0–46.0)
Lymphs Abs: 1 10*3/uL (ref 0.7–4.0)
MCHC: 33.2 g/dL (ref 30.0–36.0)
MCV: 90.2 fl (ref 78.0–100.0)
Monocytes Absolute: 0.5 10*3/uL (ref 0.1–1.0)
Monocytes Relative: 11.4 % (ref 3.0–12.0)
Neutro Abs: 2.8 10*3/uL (ref 1.4–7.7)
Neutrophils Relative %: 61.6 % (ref 43.0–77.0)
Platelets: 149 10*3/uL — ABNORMAL LOW (ref 150.0–400.0)
RBC: 3.93 Mil/uL (ref 3.87–5.11)
RDW: 15.1 % (ref 11.5–15.5)
WBC: 4.5 10*3/uL (ref 4.0–10.5)

## 2022-11-24 LAB — C-REACTIVE PROTEIN: CRP: <1 mg/dL (ref 0.5–20.0)

## 2022-11-24 LAB — EXTRA LAV TOP TUBE

## 2022-11-24 LAB — SEDIMENTATION RATE: Sed Rate: 49 mm/hr — ABNORMAL HIGH (ref 0–20)

## 2022-11-24 NOTE — Progress Notes (Signed)
Office Note 11/24/2022  CC: No chief complaint on file.  HPI:  Elaine Hall is a 47 y.o. White female who is here accompanied by her sister to reestablish care I last saw pt in 2015.  Pt seeks unifying dx's that she can then focus treatment on.  She is also interested in being tested for EBV, CMV, Lyme dz, and Alpha Gal antibody.  Aashka has had many yrs hx of severe fatigue, periods of signif jt pain (particularly hands---swelling and redness.  Knees some as well), recurrent abd pain/bloating +inability to gain weight.  Denies myalgias. Multiple specialist evaluations have revealed lab work c/w inflammatory arthritis, sjogrens syndrome, and hypothyroidism.    Prednisone in the past had excellent results for her. Plaquenil also helped well but caused vision abnormalities/side effects so she d/c'd it.    Sjogrens syndrome:  has manifested in her as recurrent parotitis/submandibular inflammation, dry eyes, dry mouth. Still has episodes of R submand gland swelling.  Hypothyroidism: she is on synthroid but pt frustrated with mgmt/inability to keep TSH wnl.  Recurrent abd pain, mostly postprandial and she has identified many types of foods that she avoids to minimize sx's.  Essentially can tolerate fruits and vegetables only.  Has hx of elevated serum protein and some albuminuria.  This has prompted hem/onc eval (Dr. Candise Che in 2021 and [facility] hem/onc this year)-->they felt like this was not a primary bone marrow disorder/plasma cell dyscrasia.  They felt it was due to her chronic inflammatory condition and reassured her.  She also had some mild decrease in GFR, recently evaluated by nephrologist (Dr. Lorie Phenix with [facility]), who also felt this was due to inflammatory condition.  At that time (11/08/22) her sCr was 1.2, GFR 57, total protein 9.3.    ROS as above, plus-->heavy and painful menses. no fevers, no CP, no SOB, no wheezing, no cough, no dizziness, no HAs, no rashes, no  melena/hematochezia.  No polyuria or polydipsia.  No focal weakness, paresthesias, or tremors.  No recent acute vision or hearing abnormalities.  No dysuria or unusual/new urinary urgency or frequency.  No recent changes in lower legs.  No palpitations.     Past Medical History:  Diagnosis Date   Hypothyroidism 09/2013   Inflammatory arthritis    Polyarthralgia/febrile illness 06/2019: ? autoimmune? ?viral/postviral?   Migraine with aura    sometimes with left hand numbness and dizziness--rizatriptan helpful as abortive med.  Normal brain MRI 09/2017.   Monoclonal paraproteinemia 2021   in the context of recent febrile illness w/arthritis and oral ulcers->suggestive of autoimmune etiology.  Dr. Candise Che recommended return to her on prn basis only    Past Surgical History:  Procedure Laterality Date   WISDOM TOOTH EXTRACTION     years ago    Family History  Problem Relation Age of Onset   Migraines Mother    Migraines Sister     Social History   Socioeconomic History   Marital status: Married    Spouse name: Jill Alexanders   Number of children: 2   Years of education: 16   Highest education level: Not on file  Occupational History    Comment: self employed  Tobacco Use   Smoking status: Never   Smokeless tobacco: Never  Substance and Sexual Activity   Alcohol use: Yes    Alcohol/week: 3.0 standard drinks of alcohol    Types: 1 Glasses of wine, 1 Cans of beer, 1 Shots of liquor per week    Comment: socially   Drug  use: No   Sexual activity: Yes  Other Topics Concern   Not on file  Social History Narrative   Married, 2 children.   Emergency planning/management officer for [employer].   No tob, occ beer or glass of wine.   Exercise: occ treadmill, plays a lot with kids.   Diet: regular american diet.   Caffeine use- soda maybe once a week, chocolate   Social Determinants of Health   Financial Resource Strain: Not on file  Food Insecurity: Not on file  Transportation Needs: Not on file   Physical Activity: Not on file  Stress: Not on file  Social Connections: Not on file  Intimate Partner Violence: Not on file    Outpatient Encounter Medications as of 11/24/2022  Medication Sig   amoxicillin-clavulanate (AUGMENTIN) 875-125 MG tablet SMARTSIG:1 Tablet(s) By Mouth Every 12 Hours   levothyroxine (SYNTHROID, LEVOTHROID) 88 MCG tablet Take 88 mcg by mouth daily before breakfast.   nitrofurantoin, macrocrystal-monohydrate, (MACROBID) 100 MG capsule nitrofurantoin monohydrate/macrocrystals 100 mg capsule  TAKE 1 CAPSULE BY MOUTH EVERY 12 HOURS FOR 7 DAYS   potassium chloride SA (KLOR-CON) 20 MEQ tablet 2 tabs ( ) twice daily for 3 days then 1 tab twice daily for 2 weeks. Recheck labs with PCP in 2 weeks.   predniSONE (DELTASONE) 5 MG tablet    rizatriptan (MAXALT-MLT) 10 MG disintegrating tablet TAKE 1 TABLET BY MOUTH AS NEEDED FOR MIGRAINE. MAY REPEAT IN 2 HOURS IF NEEDED.   valACYclovir (VALTREX) 1000 MG tablet as needed.    No facility-administered encounter medications on file as of 11/24/2022.    Allergies  Allergen Reactions   Bacitracin Other (See Comments)   Sulfamethoxazole-Trimethoprim Other (See Comments) and Nausea Only   PE; There were no vitals taken for this visit.  Physical Exam  Gen: alert, very thin but otherwise appears well.  No unusual hyperpigmentation.   Affect is pleasant, lucid thought and speech. ZOX:WRUE: no injection, icteris, swelling, or exudate.  EOMI, PERRLA. Mouth: lips without lesion/swelling.  Oral mucosa pink and moist. Oropharynx without erythema, exudate, or swelling.  Neck: R submand gland palpable.  No adenopathy or thyromegaly CV: RRR, no m/r/g.   LUNGS: CTA bilat, nonlabored resps, good aeration in all lung fields. No joint swelling or erythema or deformity.  Pertinent labs:  Last CBC Lab Results  Component Value Date   WBC 8.7 08/01/2019   HGB 11.7 (L) 08/01/2019   HCT 35.5 (L) 08/01/2019   MCV 89.9 08/01/2019    MCH 29.6 08/01/2019   RDW 13.9 08/01/2019   PLT 227 08/01/2019   Last metabolic panel Lab Results  Component Value Date   GLUCOSE 87 08/01/2019   NA 142 08/01/2019   K 2.9 (LL) 08/01/2019   CL 114 (H) 08/01/2019   CO2 19 (L) 08/01/2019   BUN 15 08/01/2019   CREATININE 1.11 (H) 08/01/2019   GFRNONAA >60 08/01/2019   CALCIUM 8.6 (L) 08/01/2019   PROT 9.0 (H) 08/01/2019   ALBUMIN 3.9 08/01/2019   LABGLOB 4.7 (H) 08/01/2019   BILITOT 0.3 08/01/2019   ALKPHOS 46 08/01/2019   AST 10 (L) 08/01/2019   ALT 9 08/01/2019   ANIONGAP 9 08/01/2019     ASSESSMENT AND PLAN:   1) Chronic polyarthralgias, chronic fatigue-->c/w  Inflammatory arthritis/RA: no rheum office notes or labs available at this time.  Hx ophthalmologic side effects from plaquenil but this med did help. Will request rheum records.  In the meantime, I've ordered ANA panel, Rh factor, CCP ab, CRP,  ESR,  Additionally, will check CMV ab's, EBV antibody panel, Lyme ab w/reflex, and Alpha Gal ab panel. Suspect her paraproteinemia is secondary to her inflammatory condition. Will get more data regarding old records and new labs and pt would like to consider getting 2nd opinion rheum consult ([facility] possibly).  2) Sjogrens syndrome; recurrent sialadenitis.  No specific tx today. Request rheum records.  3) Hypothyroidism.  Unable to access Dr. Willeen Cass office notes or labs on [facility].  Will request from her office.  Continue synthroid w/out dose change at this time (88 mcg qd).  An After Visit Summary was printed and given to the patient.  F/u 1-2 wks  Signed:  Santiago Bumpers, MD           11/24/2022

## 2022-11-25 LAB — ALPHA-GAL PANEL: Allergen, Mutton, f88: <0.1 kU/L

## 2022-11-25 LAB — LYME DISEASE SEROLOGY W/REFLEX: Lyme Total Antibody EIA: NEGATIVE

## 2022-11-25 LAB — ANA, IFA COMPREHENSIVE PANEL: SSB (La) (ENA) Antibody, IgG: >8 AI — AB

## 2022-11-25 LAB — CMV ABS, IGG+IGM (CYTOMEGALOVIRUS): CMV IgM: <30 AU/mL

## 2022-11-25 LAB — EPSTEIN-BARR VIRUS (EBV) ANTIBODY PROFILE

## 2022-11-26 LAB — EPSTEIN-BARR VIRUS (EBV) ANTIBODY PROFILE
EBV NA IgG: >600 U/mL — ABNORMAL HIGH (ref 0.0–17.9)
EBV VCA IgM: <36 U/mL (ref 0.0–35.9)

## 2022-11-27 LAB — ALPHA-GAL PANEL: CLASS: 0

## 2022-11-27 LAB — ANA, IFA COMPREHENSIVE PANEL
Scleroderma (Scl-70) (ENA) Antibody, IgG: <1 AI
ds DNA Ab: 1 IU/mL

## 2022-11-27 LAB — INTERPRETATION:

## 2022-11-29 ENCOUNTER — Telehealth: Payer: Self-pay

## 2022-11-29 LAB — ANA, IFA COMPREHENSIVE PANEL: SM/RNP: <1 AI

## 2022-11-29 LAB — ANTI-NUCLEAR AB-TITER (ANA TITER)

## 2022-11-29 LAB — ALPHA-GAL PANEL
Allergen, Pork, f26: <0.1 kU/L
Beef: <0.1 kU/L
Class: 0

## 2022-11-29 LAB — CMV ABS, IGG+IGM (CYTOMEGALOVIRUS): Cytomegalovirus Ab-IgG: <0.6 U/mL

## 2022-11-29 LAB — HEAVY METALS PANEL, BLOOD: Mercury, B: <5 mcg/L (ref <11)

## 2022-11-29 NOTE — Telephone Encounter (Signed)
Jeoffrey Massed, MD 11/28/2022  8:48 AM EDT   Pls add rh factor and anti CCP antibody level (IgG and IgA).  Dx positive ANA.--thx   Labs added thru [facility].

## 2022-12-01 LAB — ANA, IFA COMPREHENSIVE PANEL: Anti Nuclear Antibody (ANA): POSITIVE — AB

## 2022-12-01 LAB — RHEUMATOID FACTOR: Rheumatoid fact SerPl-aCnc: 121 IU/mL — ABNORMAL HIGH (ref <14)

## 2022-12-04 ENCOUNTER — Encounter: Payer: Self-pay | Admitting: Family Medicine

## 2022-12-05 LAB — ALPHA-GAL PANEL
CLASS: 0
GALACTOSE-ALPHA-1,3-GALACTOSE IGE*: <0.1 kU/L (ref <0.10)

## 2022-12-05 LAB — HEAVY METALS PANEL, BLOOD
Arsenic: <3 mcg/L (ref <23)
Lead: <1 ug/dL (ref <3.5)

## 2022-12-05 LAB — ANTI-NUCLEAR AB-TITER (ANA TITER): ANA Titer 1: 1:1280 {titer} — ABNORMAL HIGH

## 2022-12-05 LAB — CYCLIC CITRUL PEPTIDE ANTIBODY, IGG: Cyclic Citrullin Peptide Ab: <16 UNITS

## 2022-12-05 LAB — IGA: Immunoglobulin A: 314 mg/dL — ABNORMAL HIGH (ref 47–310)

## 2022-12-05 LAB — ANA, IFA COMPREHENSIVE PANEL
ENA SM Ab Ser-aCnc: <1 AI
SSB (La) (ENA) Antibody, IgG: >8 AI — AB

## 2022-12-09 ENCOUNTER — Ambulatory Visit: Payer: No Typology Code available for payment source | Admitting: Family Medicine

## 2022-12-15 NOTE — Patient Instructions (Signed)

## 2022-12-16 ENCOUNTER — Ambulatory Visit (INDEPENDENT_AMBULATORY_CARE_PROVIDER_SITE_OTHER): Payer: No Typology Code available for payment source | Admitting: Family Medicine

## 2022-12-16 ENCOUNTER — Encounter: Payer: Self-pay | Admitting: Family Medicine

## 2022-12-16 VITALS — BP 110/72 | HR 73 | Temp 98.8°F | Wt 108.0 lb

## 2022-12-16 DIAGNOSIS — M359 Systemic involvement of connective tissue, unspecified: Secondary | ICD-10-CM

## 2022-12-16 MED ORDER — PREDNISONE 5 MG PO TABS: ORAL_TABLET | ORAL | 0 refills | Status: AC

## 2022-12-16 NOTE — Progress Notes (Signed)
OFFICE VISIT  12/16/2022  CC:  Chief Complaint  Patient presents with   Follow-up    3 week follow up on fatigue.    Patient is a 47 y.o. 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   A/P as of last visit: "1) Chronic polyarthralgias, chronic fatigue-->c/w  Inflammatory arthritis/RA: no rheum office notes or labs available at this time.  Hx ophthalmologic side effects from plaquenil but this med did help. Will request rheum records.  In the meantime, I've ordered ANA panel, Rh factor, CCP ab, CRP, ESR,  Additionally, will check CMV ab's, EBV antibody panel, Lyme ab w/reflex, and Alpha Gal ab panel. Suspect her paraproteinemia is secondary to her inflammatory condition. Will get more data regarding old records and new labs and pt would like to consider getting 2nd opinion rheum consult ([facility] possibly).   2) Sjogrens syndrome; recurrent sialadenitis.  No specific tx today. Request rheum records.   3) Hypothyroidism.  Unable to access Dr. Willeen Cass office notes or labs on [facility].  Will request from her office.  Continue synthroid w/out dose change at this time (88 mcg qd)."  INTERIM HX: Elaine Hall is doing pretty well.  From the standpoint of her fatigue and joint pain she feels like things are going pretty well lately--not in any kind of acute flare.  We reviewed all of her lab work today. We have not been able to get rheumatology or endocrinology records.   Past Medical History:  Diagnosis Date   Chronic renal insufficiency, stage 2 (mild)    Cr 1.1-1.3 (GFR 50s-60s).  This may be falsely low d/t pt's low muscle mass.   Hepatic steatosis    incidentally noted on renal u/s 2021   History of shingles    Hypothyroidism 09/2013   Inflammatory arthritis    Polyarthralgia/febrile illness 06/2019: ? autoimmune? ?viral/postviral?   Migraine with aura    sometimes with left hand numbness and  dizziness--rizatriptan helpful as abortive med.  Normal brain MRI 09/2017.   Monoclonal paraproteinemia 2021   in the context of recent febrile illness w/arthritis and oral ulcers->suggestive of autoimmune etiology.  Dr. Candise Che recommended return to her on prn basis only   Nephrolithiasis     Past Surgical History:  Procedure Laterality Date   WISDOM TOOTH EXTRACTION     years ago    Outpatient Medications Prior to Visit  Medication Sig Dispense Refill   levothyroxine (SYNTHROID, LEVOTHROID) 88 MCG tablet Take 88 mcg by mouth daily before breakfast.     rizatriptan (MAXALT-MLT) 10 MG disintegrating tablet TAKE 1 TABLET BY MOUTH AS NEEDED FOR MIGRAINE. MAY REPEAT IN 2 HOURS IF NEEDED. (Patient not taking: Reported on 11/24/2022) 9 tablet 11   No facility-administered medications prior to visit.    Allergies  Allergen Reactions   Bacitracin Other (See Comments)   Sulfamethoxazole-Trimethoprim Other (See Comments) and Nausea Only    Review of Systems As per HPI  PE:    12/16/2022    9:36 AM 11/24/2022    1:46 PM 08/01/2019    1:11 PM  Vitals with BMI  Height  5\' 8"  5\' 7"   Weight 108 lbs 107 lbs 13 oz 104 lbs 13 oz  BMI 16.42 16.39 16.41  Systolic 110 107 564  Diastolic 72 74 64  Pulse 73 75 65   Physical Exam  Gen: Alert, well appearing.  Patient is oriented to person, place, time, and situation. AFFECT: pleasant, lucid thought and  speech. No further exam today  LABS:  Last CBC Lab Results  Component Value Date   WBC 4.5 11/24/2022   HGB 11.8 (L) 11/24/2022   HCT 35.4 (L) 11/24/2022   MCV 90.2 11/24/2022   MCH 29.6 08/01/2019   RDW 15.1 11/24/2022   PLT 149.0 (L) 11/24/2022   Last metabolic panel Lab Results  Component Value Date   GLUCOSE 69 (L) 11/24/2022   NA 138 11/24/2022   K 3.2 (L) 11/24/2022   CL 111 11/24/2022   CO2 20 11/24/2022   BUN 17 11/24/2022   CREATININE 1.19 11/24/2022   GFRNONAA >60 08/01/2019   CALCIUM 9.0 11/24/2022   PROT 8.8 (H)  11/24/2022   ALBUMIN 4.2 11/24/2022   LABGLOB 4.7 (H) 08/01/2019   BILITOT 0.4 11/24/2022   ALKPHOS 52 11/24/2022   AST 15 11/24/2022   ALT 10 11/24/2022   ANIONGAP 9 08/01/2019   Lab Results  Component Value Date   ESRSEDRATE 49 (H) 11/24/2022   Lab Results  Component Value Date   CRP <1.0 11/24/2022   IMPRESSION AND PLAN:  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 (Pertinent positive labs are elevated sed rate, markedly high ANA titer, speckled pattern, positive RA and negative CCP ab).   Naturally, I would like rheumatology to be involved but understandably Elaine Hall is at a point where she is not confident that a rheumatologist would add anything or recommend anything or test for anything that would help her situation right now.  She has adapted to her condition/symptoms. She would rather continue with this approach than take medication. She is open to ongoing monitoring of kidney and liver function testing as well as sed rate and possibly additional labs as appropriate. Will see each other again in 3 months and do some labs at that time. I did send in a prescription for prednisone 5 mg tabs to take in a tapered fashion should she get a flare--she has responded to prednisone excellently in the past.  An After Visit Summary was printed and given to the patient.  FOLLOW UP: Return in about 3 months (around 03/18/2023) for routine chronic illness f/u.  Signed:  Santiago Bumpers, MD           12/16/2022

## 2022-12-22 ENCOUNTER — Telehealth: Payer: Self-pay | Admitting: Family Medicine

## 2022-12-22 MED ORDER — AMOXICILLIN-POT CLAVULANATE 875-125 MG PO TABS: 1.0000 | ORAL_TABLET | Freq: Two times a day (BID) | ORAL | 0 refills | Status: AC

## 2022-12-22 NOTE — Telephone Encounter (Signed)
Please Advise

## 2022-12-22 NOTE — Telephone Encounter (Signed)
Pt advised medication sent to pharmacy  

## 2022-12-22 NOTE — Telephone Encounter (Signed)
Patient states that during her appointment on 6/21/ she discussed her gland under her jaw and now it is irritated like in the past and she is requesting Augmentin.  Please inform patient if and when this can be sent to the pharmacy.

## 2023-02-14 ENCOUNTER — Encounter: Payer: Self-pay | Admitting: Family Medicine

## 2023-03-21 ENCOUNTER — Other Ambulatory Visit: Payer: No Typology Code available for payment source

## 2023-03-21 ENCOUNTER — Other Ambulatory Visit: Payer: Self-pay | Admitting: Family Medicine

## 2023-03-21 DIAGNOSIS — M130 Polyarthritis, unspecified: Secondary | ICD-10-CM

## 2023-03-23 LAB — ANA SCREEN,IFA,REFLEX TITER/PATTERN,REFLEX MPLX 11 AB CASCADE
Cyclic Citrullin Peptide Ab: <16 UNITS
Rheumatoid fact SerPl-aCnc: 205 IU/mL — ABNORMAL HIGH (ref <14)

## 2023-03-23 LAB — ANTI-NUCLEAR AB-TITER (ANA TITER)

## 2023-03-26 LAB — ANA SCREEN,IFA,REFLEX TITER/PATTERN,REFLEX MPLX 11 AB CASCADE
Anti Nuclear Antibody (ANA): POSITIVE — AB
MUTATED CITRULLINATED VIMENTIN (MCV) AB: <20 U/mL (ref <20)

## 2023-03-26 LAB — TIER 1
Chromatin (Nucleosomal) Antibody: 1.8 AI — AB
ENA SM Ab Ser-aCnc: <1 AI
Ribonucleic Protein(ENA) Antibody, IgG: <1 AI
SM/RNP: <1 AI
ds DNA Ab: 1 [IU]/mL

## 2023-03-26 LAB — INTERPRETATION

## 2023-03-26 LAB — ANTI-NUCLEAR AB-TITER (ANA TITER): ANA Titer 1: 1:1280 {titer} — ABNORMAL HIGH

## 2023-06-12 ENCOUNTER — Other Ambulatory Visit: Payer: Self-pay | Admitting: *Deleted

## 2023-06-12 DIAGNOSIS — E063 Autoimmune thyroiditis: Secondary | ICD-10-CM

## 2023-06-13 ENCOUNTER — Ambulatory Visit
Admission: RE | Admit: 2023-06-13 | Discharge: 2023-06-13 | Disposition: A | Discharge status: Home / Self Care | Payer: No Typology Code available for payment source | Source: Ambulatory Visit | Attending: *Deleted | Admitting: *Deleted

## 2023-06-13 DIAGNOSIS — E063 Autoimmune thyroiditis: Secondary | ICD-10-CM

## 2023-08-01 IMAGING — MG MM DIGITAL DIAGNOSTIC UNILAT*L* W/ TOMO W/ CAD
6 series · 6 of 18 positions shown · non-contrast
Comparison: Previous exam(s).

CLINICAL DATA: Patient recalled from screening for left breast
distortion.

EXAM:
DIGITAL DIAGNOSTIC UNILATERAL LEFT MAMMOGRAM WITH TOMOSYNTHESIS AND
CAD
TECHNIQUE: Left digital diagnostic mammography and breast tomosynthesis was
performed. The images were evaluated with computer-aided detection.

[L MLO synth-2D]
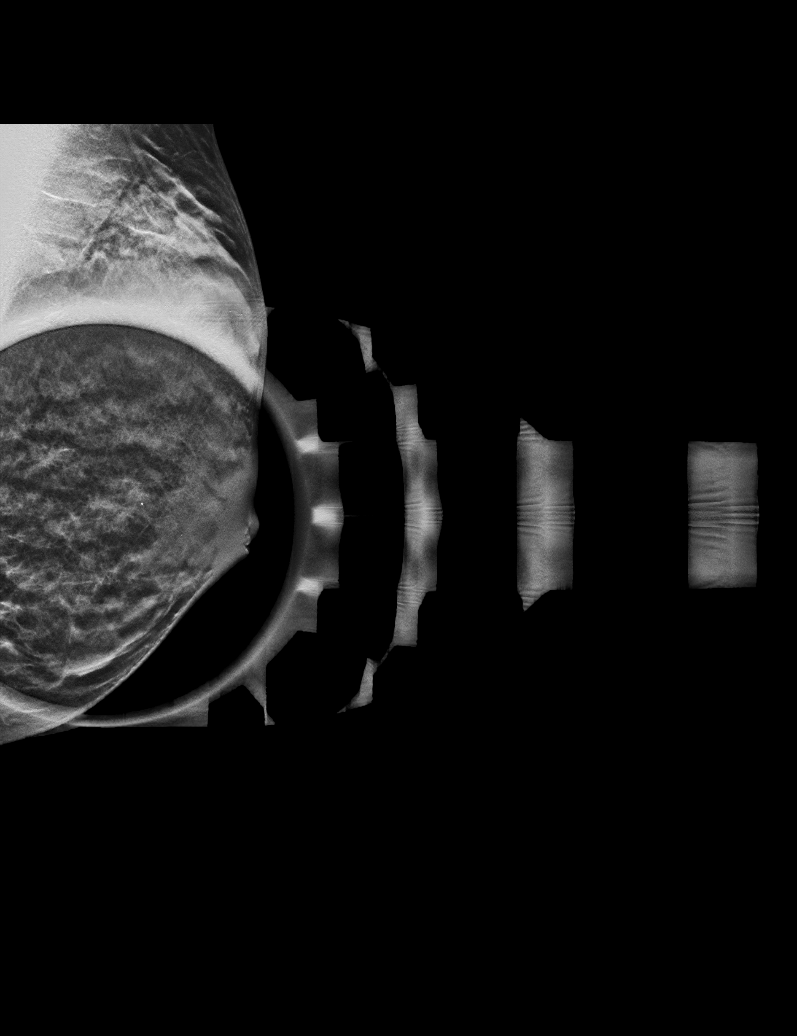

[L CC synth-2D]
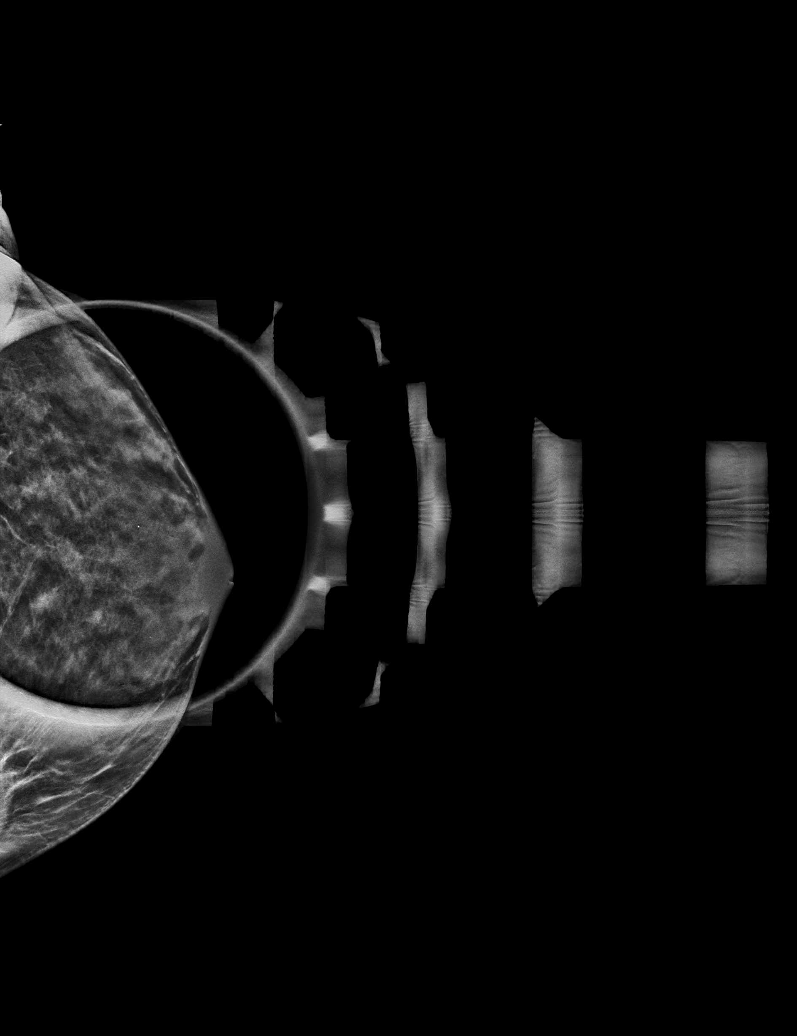

[L ML synth-2D]
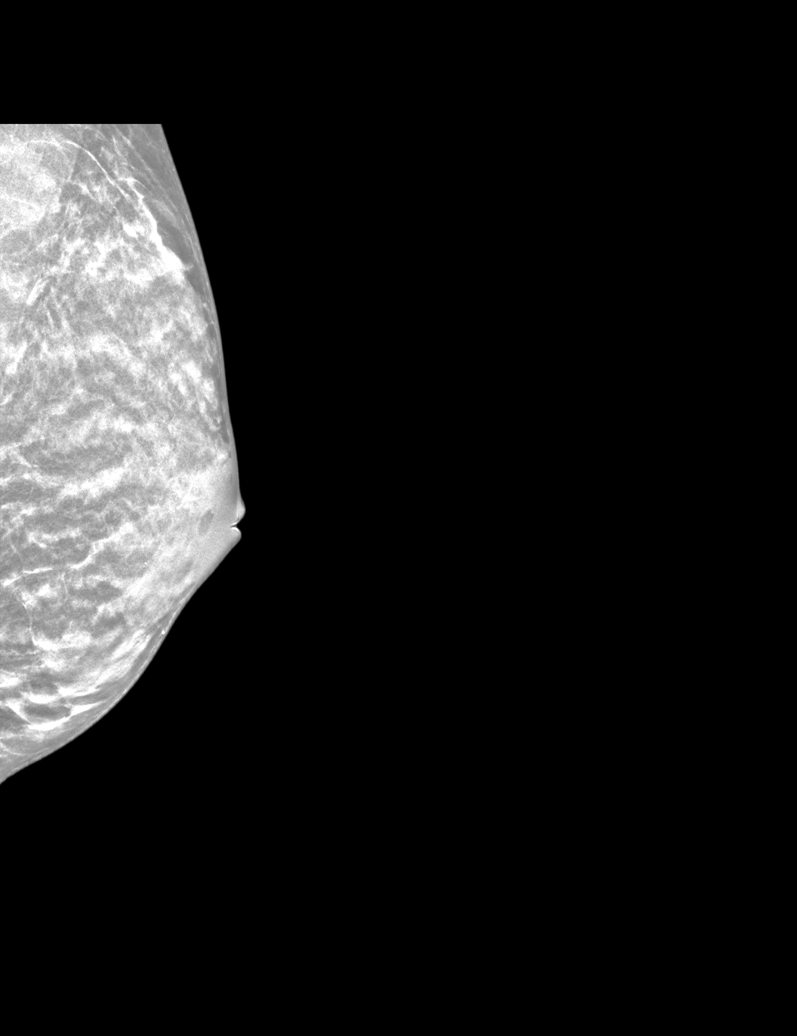

[L ML tomo · tomo slice 17/32.0]
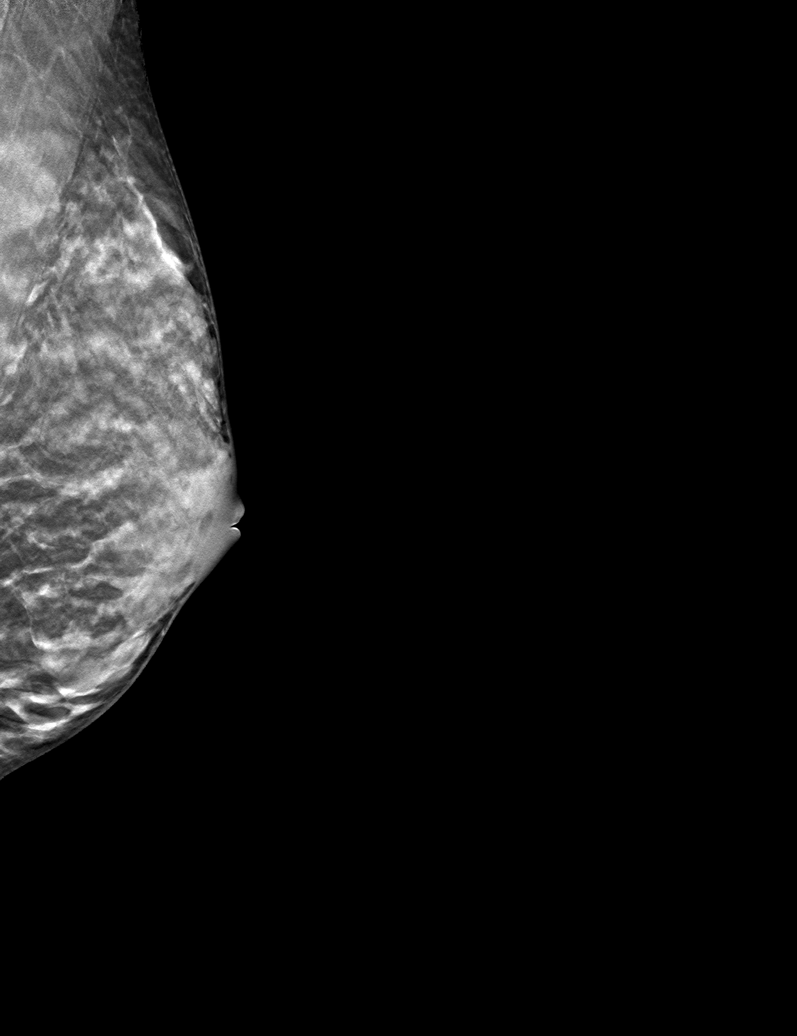

[L MLO tomo · tomo slice 15/28.0]
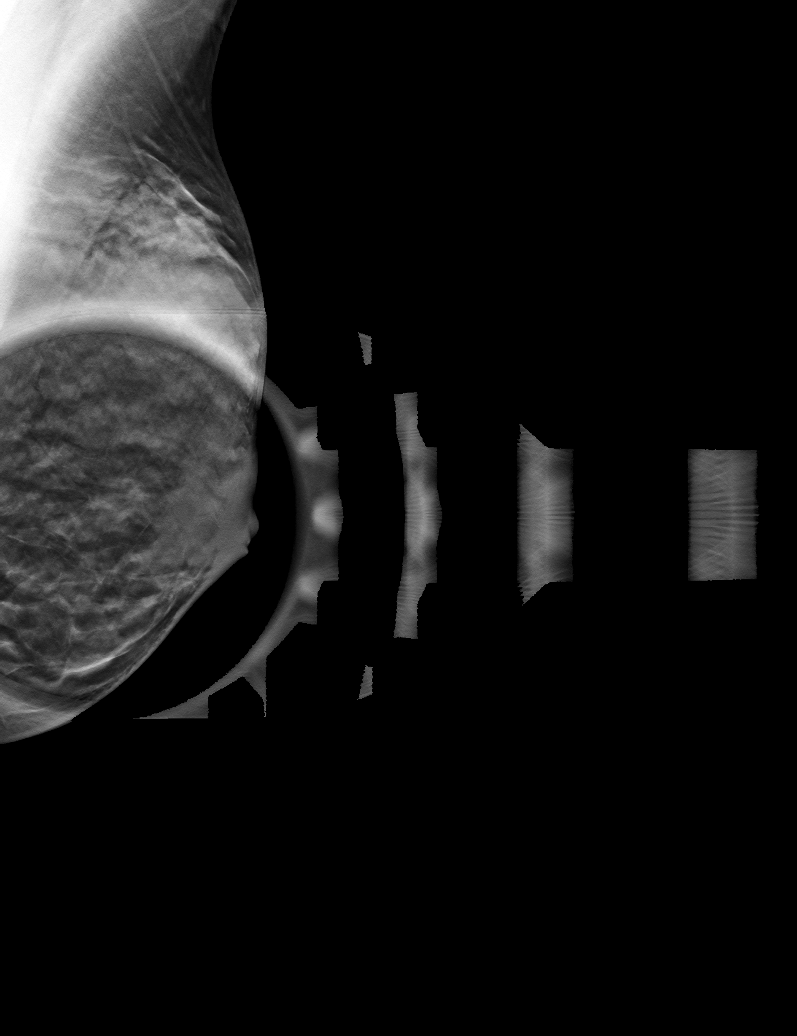

[L CC tomo · tomo slice 19/36.0]
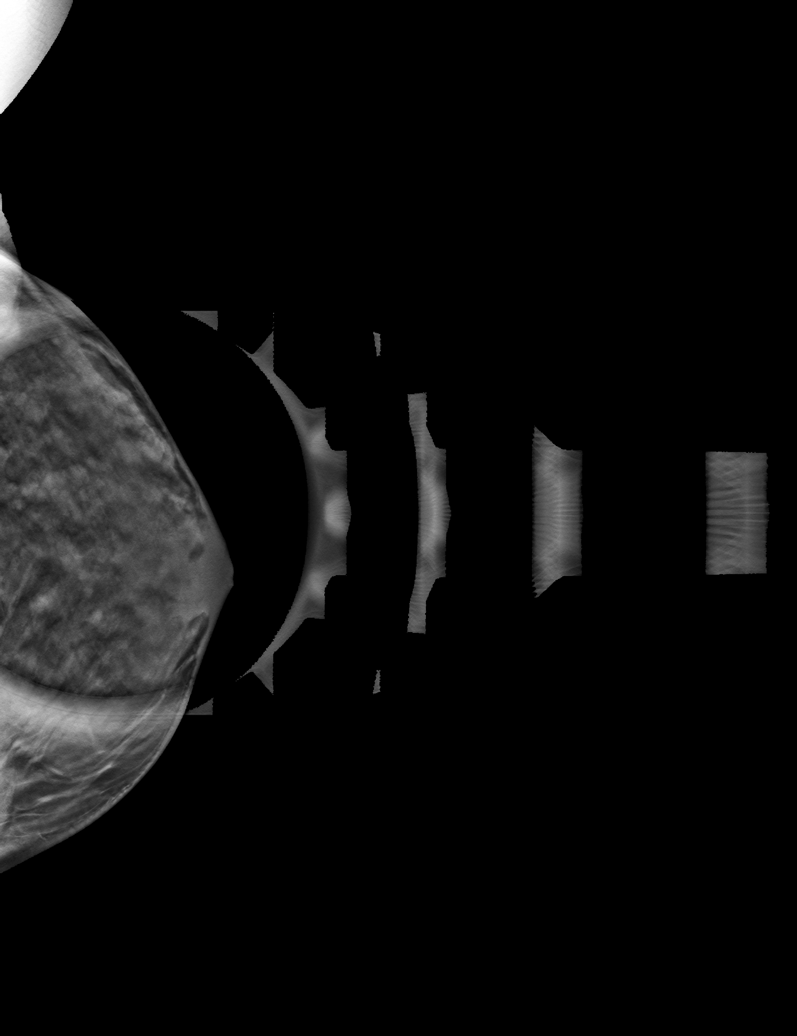

[6 of 18 positions shown; findings below may reference images not displayed]

ACR Breast Density Category c: The breast tissue is heterogeneously
dense, which may obscure small masses.
FINDINGS: Questioned distortion within the left breast resolved with
additional imaging compatible with dense fibroglandular tissue. No
suspicious findings.
IMPRESSION: No mammographic evidence for malignancy.

RECOMMENDATION:
Screening mammogram in one year.(Code:MC-S-HAQ)

I have discussed the findings and recommendations with the patient.
If applicable, a reminder letter will be sent to the patient
regarding the next appointment.

BI-RADS CATEGORY  1: Negative.
